# Patient Record
Sex: Female | Born: 1965 | Race: White | Hispanic: No | Marital: Married | Smoking: Never smoker
Health system: Southern US, Community
[De-identification: ages and names within clinical notes are randomized; demographics above are authoritative.]

## PROBLEM LIST (undated history)

## (undated) DIAGNOSIS — F1910 Other psychoactive substance abuse, uncomplicated: Secondary | ICD-10-CM

## (undated) DIAGNOSIS — C801 Malignant (primary) neoplasm, unspecified: Secondary | ICD-10-CM

## (undated) HISTORY — DX: Other psychoactive substance abuse, uncomplicated: F19.10

## (undated) HISTORY — PX: AUGMENTATION MAMMAPLASTY: SUR837

## (undated) HISTORY — DX: Malignant (primary) neoplasm, unspecified: C80.1

---

## 2002-10-14 HISTORY — PX: LAPAROSCOPIC PARTIAL COLECTOMY: SHX5907

## 2004-02-26 LAB — HM COLONOSCOPY: HM Colonoscopy: NORMAL

## 2004-06-24 ENCOUNTER — Ambulatory Visit: Payer: Self-pay | Admitting: Oncology

## 2004-07-14 ENCOUNTER — Ambulatory Visit: Payer: Self-pay | Admitting: Oncology

## 2004-09-03 ENCOUNTER — Ambulatory Visit: Payer: Self-pay | Admitting: Gastroenterology

## 2004-11-05 ENCOUNTER — Ambulatory Visit (HOSPITAL_COMMUNITY): Admission: RE | Admit: 2004-11-05 | Discharge: 2004-11-05 | Payer: Self-pay | Admitting: Oncology

## 2004-11-06 ENCOUNTER — Ambulatory Visit: Payer: Self-pay | Admitting: Oncology

## 2004-11-11 ENCOUNTER — Ambulatory Visit: Payer: Self-pay | Admitting: Oncology

## 2005-04-06 ENCOUNTER — Ambulatory Visit: Payer: Self-pay | Admitting: Oncology

## 2005-04-13 ENCOUNTER — Ambulatory Visit: Payer: Self-pay | Admitting: Oncology

## 2005-09-07 ENCOUNTER — Ambulatory Visit: Payer: Self-pay | Admitting: Oncology

## 2005-09-10 ENCOUNTER — Ambulatory Visit (HOSPITAL_COMMUNITY): Admission: RE | Admit: 2005-09-10 | Discharge: 2005-09-10 | Payer: Self-pay | Admitting: Internal Medicine

## 2005-09-13 ENCOUNTER — Ambulatory Visit: Payer: Self-pay | Admitting: Oncology

## 2006-01-18 ENCOUNTER — Encounter: Admission: RE | Admit: 2006-01-18 | Discharge: 2006-01-18 | Payer: Self-pay | Admitting: Unknown Physician Specialty

## 2006-02-03 ENCOUNTER — Ambulatory Visit: Payer: Self-pay | Admitting: Oncology

## 2006-02-11 ENCOUNTER — Ambulatory Visit: Payer: Self-pay | Admitting: Oncology

## 2006-06-23 ENCOUNTER — Ambulatory Visit: Payer: Self-pay | Admitting: Oncology

## 2006-07-14 ENCOUNTER — Ambulatory Visit: Payer: Self-pay | Admitting: Oncology

## 2006-12-13 ENCOUNTER — Ambulatory Visit: Payer: Self-pay | Admitting: Oncology

## 2007-01-05 ENCOUNTER — Ambulatory Visit: Payer: Self-pay | Admitting: Oncology

## 2007-01-12 ENCOUNTER — Ambulatory Visit: Payer: Self-pay | Admitting: Oncology

## 2007-09-14 ENCOUNTER — Ambulatory Visit: Payer: Self-pay | Admitting: Oncology

## 2007-09-20 ENCOUNTER — Ambulatory Visit (HOSPITAL_COMMUNITY): Admission: RE | Admit: 2007-09-20 | Discharge: 2007-09-20 | Payer: Self-pay | Admitting: Oncology

## 2007-09-21 ENCOUNTER — Ambulatory Visit: Payer: Self-pay | Admitting: Oncology

## 2007-09-29 ENCOUNTER — Ambulatory Visit (HOSPITAL_COMMUNITY): Admission: RE | Admit: 2007-09-29 | Discharge: 2007-09-29 | Payer: Self-pay | Admitting: Gastroenterology

## 2007-10-15 ENCOUNTER — Ambulatory Visit: Payer: Self-pay | Admitting: Oncology

## 2007-11-01 ENCOUNTER — Encounter: Admission: RE | Admit: 2007-11-01 | Discharge: 2007-11-01 | Payer: Self-pay | Admitting: Oncology

## 2009-01-20 ENCOUNTER — Encounter: Admission: RE | Admit: 2009-01-20 | Discharge: 2009-01-20 | Payer: Self-pay | Admitting: Oncology

## 2009-02-11 ENCOUNTER — Ambulatory Visit: Payer: Self-pay | Admitting: Oncology

## 2009-02-13 ENCOUNTER — Ambulatory Visit: Payer: Self-pay | Admitting: Oncology

## 2009-02-17 ENCOUNTER — Ambulatory Visit (HOSPITAL_COMMUNITY): Admission: RE | Admit: 2009-02-17 | Discharge: 2009-02-17 | Payer: Self-pay | Admitting: Oncology

## 2009-03-13 ENCOUNTER — Ambulatory Visit: Payer: Self-pay | Admitting: Oncology

## 2009-07-12 ENCOUNTER — Ambulatory Visit (HOSPITAL_COMMUNITY): Admission: EM | Admit: 2009-07-12 | Discharge: 2009-07-12 | Payer: Self-pay | Admitting: Orthopaedic Surgery

## 2009-09-13 DIAGNOSIS — C801 Malignant (primary) neoplasm, unspecified: Secondary | ICD-10-CM

## 2009-09-13 HISTORY — DX: Malignant (primary) neoplasm, unspecified: C80.1

## 2009-11-07 ENCOUNTER — Encounter: Admission: RE | Admit: 2009-11-07 | Discharge: 2009-11-07 | Payer: Self-pay | Admitting: Occupational Medicine

## 2010-02-11 ENCOUNTER — Ambulatory Visit: Payer: Self-pay | Admitting: Oncology

## 2010-02-25 ENCOUNTER — Encounter: Admission: RE | Admit: 2010-02-25 | Discharge: 2010-02-25 | Payer: Self-pay | Admitting: Oncology

## 2010-03-05 ENCOUNTER — Encounter: Admission: RE | Admit: 2010-03-05 | Discharge: 2010-03-05 | Payer: Self-pay | Admitting: Oncology

## 2010-03-12 ENCOUNTER — Ambulatory Visit: Payer: Self-pay | Admitting: Oncology

## 2010-03-12 ENCOUNTER — Ambulatory Visit: Payer: Self-pay | Admitting: Obstetrics & Gynecology

## 2010-03-13 ENCOUNTER — Ambulatory Visit: Payer: Self-pay | Admitting: Oncology

## 2010-07-21 ENCOUNTER — Emergency Department (HOSPITAL_COMMUNITY): Admission: EM | Admit: 2010-07-21 | Discharge: 2010-07-21 | Payer: Self-pay | Admitting: Family Medicine

## 2010-09-24 ENCOUNTER — Ambulatory Visit (HOSPITAL_COMMUNITY)
Admission: RE | Admit: 2010-09-24 | Discharge: 2010-09-24 | Payer: Self-pay | Source: Home / Self Care | Attending: Gastroenterology | Admitting: Gastroenterology

## 2010-10-04 ENCOUNTER — Encounter: Payer: Self-pay | Admitting: Oncology

## 2010-12-17 LAB — POCT I-STAT 4, (NA,K, GLUC, HGB,HCT)
Hemoglobin: 13.6 g/dL (ref 12.0–15.0)
Potassium: 5.8 mEq/L — ABNORMAL HIGH (ref 3.5–5.1)
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
## 2011-01-26 NOTE — Op Note (Signed)
NAME:  Carla King, Carla King                ACCOUNT NO.:  [id number]   MEDICAL RECORD NO.:  [id number]          PATIENT TYPE:  AMB   LOCATION:  [facility]O                         FACILITY:  [facility]   PHYSICIAN:  Petra Kuba, M.D.    DATE OF BIRTH:  1966-01-04   DATE OF PROCEDURE:  DATE OF DISCHARGE:                               OPERATIVE REPORT   PROCEDURE:  Colonoscopy.   INDICATIONS:  History of colon cancer, due for colonic screening.   Consent was signed after risks, benefits, and options were thoroughly  discussed in the office.   MEDICATIONS USED:  Fentanyl 125, Versed 15, Phenergan 25.   PROCEDURE:  Rectal inspection is pertinent for external hemorrhoids,  small.  The general examination was negative.  Video pediatric  colonoscope was inserted and easily advanced around the colon to the  anastomosis, which was identified by the small bowel and to colon side.  No abnormalities were seen on insertion.  The scope was inserted a short  ways into the terminal ileum which was normal.  Photo documentation was  obtained.  The scope was slowly withdrawn.  Prep was adequate.  There  was some liquid stool that required washing and suction on slow withdraw  back to the rectum.  No polyps, tumors, masses, or diverticula were  seen.  Once back in the rectum, anal rectal __________ on retroflexion  confirmed some small hemorrhoids.  The scope was straightened and re-  advanced a short ways up the left side of the colon.  Air was suctioned  and scope removed.  The patient tolerated the procedure well.  There was  no obvious immediate complication.   ENDOSCOPIC DIAGNOSES:  1. Internal and external small hemorrhoids.  2. Otherwise within normal limits to the anastomosis in the right      colon and terminal ileum.   PLAN:  I will see her back p.r.n.  Return care to her oncology team and  I will recheck colon screening in three years.           ______________________________  Petra Kuba,  M.D.     MEM/MEDQ  D:  09/29/2007  T:  09/29/2007  Job:  [id number]   cc:   Johney Maine

## 2012-01-17 ENCOUNTER — Telehealth: Payer: Self-pay | Admitting: Internal Medicine

## 2012-01-17 DIAGNOSIS — Z1239 Encounter for other screening for malignant neoplasm of breast: Secondary | ICD-10-CM

## 2012-01-17 NOTE — Telephone Encounter (Signed)
Patient wanting a mammogram done before her physical on June 7,2013 because she has had colon cancer.

## 2012-01-17 NOTE — Telephone Encounter (Signed)
she has not been seen in this office yet.  Does she want to have it done at her usual localse (in [place]) or in [place]?

## 2012-01-18 NOTE — Telephone Encounter (Signed)
order in [place] 

## 2012-01-18 NOTE — Telephone Encounter (Signed)
Patient has gone to [place] in the past

## 2012-02-18 ENCOUNTER — Ambulatory Visit (INDEPENDENT_AMBULATORY_CARE_PROVIDER_SITE_OTHER): Payer: BC Managed Care – PPO | Admitting: Internal Medicine

## 2012-02-18 ENCOUNTER — Other Ambulatory Visit (HOSPITAL_COMMUNITY)
Admission: RE | Admit: 2012-02-18 | Discharge: 2012-02-18 | Disposition: A | Discharge status: Home / Self Care | Payer: BC Managed Care – PPO | Source: Ambulatory Visit | Attending: Internal Medicine | Admitting: Internal Medicine

## 2012-02-18 ENCOUNTER — Encounter: Payer: Self-pay | Admitting: Internal Medicine

## 2012-02-18 VITALS — BP 94/62 | HR 73 | Temp 98.2°F | Resp 14 | Ht 64.5 in | Wt 129.0 lb

## 2012-02-18 DIAGNOSIS — F419 Anxiety disorder, unspecified: Secondary | ICD-10-CM | POA: Insufficient documentation

## 2012-02-18 DIAGNOSIS — R5383 Other fatigue: Secondary | ICD-10-CM

## 2012-02-18 DIAGNOSIS — Z01419 Encounter for gynecological examination (general) (routine) without abnormal findings: Secondary | ICD-10-CM | POA: Insufficient documentation

## 2012-02-18 DIAGNOSIS — F418 Other specified anxiety disorders: Secondary | ICD-10-CM

## 2012-02-18 DIAGNOSIS — F411 Generalized anxiety disorder: Secondary | ICD-10-CM

## 2012-02-18 DIAGNOSIS — Z0000 Encounter for general adult medical examination without abnormal findings: Secondary | ICD-10-CM

## 2012-02-18 DIAGNOSIS — F341 Dysthymic disorder: Secondary | ICD-10-CM

## 2012-02-18 DIAGNOSIS — Z124 Encounter for screening for malignant neoplasm of cervix: Secondary | ICD-10-CM

## 2012-02-18 DIAGNOSIS — Z1239 Encounter for other screening for malignant neoplasm of breast: Secondary | ICD-10-CM

## 2012-02-18 DIAGNOSIS — R5381 Other malaise: Secondary | ICD-10-CM

## 2012-02-18 DIAGNOSIS — F5105 Insomnia due to other mental disorder: Secondary | ICD-10-CM

## 2012-02-18 DIAGNOSIS — Z13220 Encounter for screening for lipoid disorders: Secondary | ICD-10-CM

## 2012-02-18 DIAGNOSIS — Z85038 Personal history of other malignant neoplasm of large intestine: Secondary | ICD-10-CM

## 2012-02-18 DIAGNOSIS — F489 Nonpsychotic mental disorder, unspecified: Secondary | ICD-10-CM

## 2012-02-18 LAB — CBC WITH DIFFERENTIAL/PLATELET
Basophils Absolute: 0 10*3/uL (ref 0.0–0.1)
Basophils Relative: 1 % (ref 0–1)
Eosinophils Absolute: 0 10*3/uL (ref 0.0–0.7)
MCH: 30.7 pg (ref 26.0–34.0)
MCHC: 33.2 g/dL (ref 30.0–36.0)
Neutrophils Relative %: 65 % (ref 43–77)
Platelets: 257 10*3/uL (ref 150–400)
RDW: 12.8 % (ref 11.5–15.5)

## 2012-02-18 LAB — CEA: CEA: 0.8 ng/mL (ref 0.0–5.0)

## 2012-02-18 LAB — TSH: TSH: 0.341 u[IU]/mL — ABNORMAL LOW (ref 0.350–4.500)

## 2012-02-18 MED ORDER — ALPRAZOLAM 0.5 MG PO TBDP: 0.5000 mg | ORAL_TABLET | Freq: Every day | ORAL | Status: AC | PRN

## 2012-02-18 MED ORDER — ZOLPIDEM TARTRATE 10 MG PO TABS: 10.0000 mg | ORAL_TABLET | Freq: Every evening | ORAL | Status: DC | PRN

## 2012-02-18 NOTE — Progress Notes (Signed)
Patient ID: Carla King, female   DOB: 1966-07-19, 46 y.o.   MRN: [id number] Annual exam. . Last seen one year ago,  [name] through divorce.,.  Insomnia, anxiety, wt loss,  Depression.  Not drinking.   Subjective:     Carla King is a 46 y.o. female here for a routine exam.  Current complaints: anxiety, insomnia  Personal health questionnaire reviewed: yes.   Gynecologic History Patient's last menstrual period was 02/07/2012. Contraception: none Last Pap: 2011. Results were: normal Last mammogram: 2012. Results were: normal  Obstetric History OB History    Grav Para Term Preterm Abortions TAB SAB Ect Mult Living                   The following portions of the patient's history were reviewed and updated as appropriate: allergies, current medications, past family history, past medical history, past social history, past surgical history and problem list.  Review of Systems Pertinent items are noted in HPI.    Objective:    BP 94/62  Pulse 73  Temp(Src) 98.2 F (36.8 C) (Oral)  Resp 14  Ht 5' 4.5" (1.638 m)  Wt 129 lb (58.514 kg)  BMI 21.80 kg/m2  SpO2 97%  LMP 02/07/2012  General Appearance:    Alert, cooperative, no distress, appears stated age  Head:    Normocephalic, without obvious abnormality, atraumatic  Eyes:    PERRL, conjunctiva/corneas clear, EOM's intact, fundi    benign, both eyes  Ears:    Normal TM's and external ear canals, both ears  Nose:   Nares normal, septum midline, mucosa normal, no drainage    or sinus tenderness  Throat:   Lips, mucosa, and tongue normal; teeth and gums normal  Neck:   Supple, symmetrical, trachea midline, no adenopathy;    thyroid:  no enlargement/tenderness/nodules; no carotid   bruit or JVD  Back:     Symmetric, no curvature, ROM normal, no CVA tenderness  Lungs:     Clear to auscultation bilaterally, respirations unlabored  Chest Wall:    No tenderness or deformity   Heart:    Regular rate and rhythm, S1 and S2 normal, no  murmur, rub   or gallop  Breast Exam:    No tenderness, masses, or nipple abnormality  Abdomen:     Soft, non-tender, bowel sounds active all four quadrants,    no masses, no organomegaly  Genitalia:    Normal female without lesion, discharge or tenderness  Rectal:    Normal tone, normal prostate, no masses or tenderness;   guaiac negative stool  Extremities:   Extremities normal, atraumatic, no cyanosis or edema  Pulses:   2+ and symmetric all extremities  Skin:   Skin color, texture, turgor normal, no rashes or lesions  Lymph nodes:   Cervical, supraclavicular, and axillary nodes normal  Neurologic:   CNII-XII intact, normal strength, sensation and reflexes    throughout      Assessment:    Healthy female exam.    Plan:    Education reviewed: depression evaluation. Mammogram ordered.    Routine general medical examination at a health care facility PAP and pelvic were done breast exam done.  She has breast implants; mammogram ordered.   Anxiety associated with depression Trial of alprazolam  Low dose for management of evening anxiety.  Symptoms are circumstance driven (divorce) and therefore 30 days of medication discussed,  If nore is needed she will return to discuss starting SSRI therapy  Updated Medication List Outpatient Encounter Prescriptions as of 02/18/2012  Medication Sig Dispense Refill  . ALPRAZolam (NIRAVAM) 0.5 MG dissolvable tablet Take 1 tablet (0.5 mg total) by mouth daily as needed for anxiety.  30 tablet  0  . zolpidem (AMBIEN) 10 MG tablet Take 1 tablet (10 mg total) by mouth at bedtime as needed for sleep.  30 tablet  1

## 2012-02-18 NOTE — Patient Instructions (Signed)
Sign up for my chart Get your mammogram   return one month

## 2012-02-19 LAB — COMPLETE METABOLIC PANEL WITH GFR
Albumin: 4.6 g/dL (ref 3.5–5.2)
BUN: 8 mg/dL (ref 6–23)
CO2: 22 mEq/L (ref 19–32)
GFR, Est African American: >89 mL/min
GFR, Est Non African American: >89 mL/min
Glucose, Bld: 88 mg/dL (ref 70–99)
Potassium: 4 mEq/L (ref 3.5–5.3)
Sodium: 139 mEq/L (ref 135–145)
Total Protein: 7 g/dL (ref 6.0–8.3)

## 2012-02-19 LAB — LIPID PANEL
Cholesterol: 180 mg/dL (ref 0–200)
HDL: 68 mg/dL (ref >39)

## 2012-02-20 NOTE — Assessment & Plan Note (Signed)
Trial of alprazolam  Low dose for management of evening anxiety.  Symptoms are circumstance driven (divorce) and therefore 30 days of medication discussed,  If nore is needed she will return to discuss starting SSRI therapy

## 2012-02-20 NOTE — Assessment & Plan Note (Signed)
PAP and pelvic were done breast exam done.  She has breast implants; mammogram ordered.

## 2012-02-24 ENCOUNTER — Ambulatory Visit: Payer: Self-pay | Admitting: Internal Medicine

## 2012-03-07 ENCOUNTER — Telehealth: Payer: Self-pay | Admitting: Internal Medicine

## 2012-03-07 NOTE — Telephone Encounter (Signed)
Pt called checking on therapist appointment at [facility]

## 2012-03-10 ENCOUNTER — Encounter: Payer: Self-pay | Admitting: Internal Medicine

## 2012-03-14 NOTE — Telephone Encounter (Signed)
I tried calling them on Friday however I didn't leave a message.  However I have left message today for them to return my call.

## 2012-03-15 NOTE — Telephone Encounter (Signed)
Darl Pikes returned my call and she states that she didn't see a referral she asked that the patient call and make her own appointment. I called patient and gave her their number which is [phone].

## 2012-03-15 NOTE — Telephone Encounter (Signed)
Carla King from [facility] called back and left me a VM asking me to return her call.  I have left another message for her to return my call.

## 2012-05-22 ENCOUNTER — Encounter: Payer: Self-pay | Admitting: Internal Medicine

## 2012-12-22 ENCOUNTER — Ambulatory Visit: Payer: BC Managed Care – PPO | Admitting: Internal Medicine

## 2013-02-21 ENCOUNTER — Telehealth: Payer: Self-pay | Admitting: *Deleted

## 2013-02-21 DIAGNOSIS — R5383 Other fatigue: Secondary | ICD-10-CM

## 2013-02-21 DIAGNOSIS — R7989 Other specified abnormal findings of blood chemistry: Secondary | ICD-10-CM

## 2013-02-21 DIAGNOSIS — E785 Hyperlipidemia, unspecified: Secondary | ICD-10-CM

## 2013-02-21 NOTE — Telephone Encounter (Signed)
Pt is coming in for labs Friday 06.13.2014 what labs and dx?

## 2013-02-23 ENCOUNTER — Encounter: Payer: Self-pay | Admitting: Internal Medicine

## 2013-02-23 ENCOUNTER — Encounter (INDEPENDENT_AMBULATORY_CARE_PROVIDER_SITE_OTHER): Payer: BC Managed Care – PPO | Admitting: Internal Medicine

## 2013-02-23 ENCOUNTER — Ambulatory Visit (INDEPENDENT_AMBULATORY_CARE_PROVIDER_SITE_OTHER): Payer: BC Managed Care – PPO | Admitting: Internal Medicine

## 2013-02-23 DIAGNOSIS — R7989 Other specified abnormal findings of blood chemistry: Secondary | ICD-10-CM

## 2013-02-23 DIAGNOSIS — Z85038 Personal history of other malignant neoplasm of large intestine: Secondary | ICD-10-CM

## 2013-02-23 DIAGNOSIS — R5383 Other fatigue: Secondary | ICD-10-CM

## 2013-02-23 DIAGNOSIS — F419 Anxiety disorder, unspecified: Secondary | ICD-10-CM

## 2013-02-23 DIAGNOSIS — F411 Generalized anxiety disorder: Secondary | ICD-10-CM

## 2013-02-23 DIAGNOSIS — F489 Nonpsychotic mental disorder, unspecified: Secondary | ICD-10-CM

## 2013-02-23 DIAGNOSIS — R5381 Other malaise: Secondary | ICD-10-CM

## 2013-02-23 DIAGNOSIS — E785 Hyperlipidemia, unspecified: Secondary | ICD-10-CM

## 2013-02-23 DIAGNOSIS — R6889 Other general symptoms and signs: Secondary | ICD-10-CM

## 2013-02-23 DIAGNOSIS — F341 Dysthymic disorder: Secondary | ICD-10-CM

## 2013-02-23 DIAGNOSIS — F418 Other specified anxiety disorders: Secondary | ICD-10-CM

## 2013-02-23 LAB — COMPREHENSIVE METABOLIC PANEL
Albumin: 4.2 g/dL (ref 3.5–5.2)
BUN: 15 mg/dL (ref 6–23)
CO2: 23 mEq/L (ref 19–32)
GFR: 131.05 mL/min (ref >60.00)
Glucose, Bld: 86 mg/dL (ref 70–99)
Potassium: 4.2 mEq/L (ref 3.5–5.1)
Sodium: 141 mEq/L (ref 135–145)
Total Protein: 6.4 g/dL (ref 6.0–8.3)

## 2013-02-23 LAB — CBC WITH DIFFERENTIAL/PLATELET
Eosinophils Relative: 2.6 % (ref 0.0–5.0)
Lymphocytes Relative: 35.7 % (ref 12.0–46.0)
Monocytes Relative: 9.6 % (ref 3.0–12.0)
Neutrophils Relative %: 51.3 % (ref 43.0–77.0)
Platelets: 226 10*3/uL (ref 150.0–400.0)
RBC: 4.1 Mil/uL (ref 3.87–5.11)
WBC: 4.2 10*3/uL — ABNORMAL LOW (ref 4.5–10.5)

## 2013-02-23 LAB — T4, FREE: Free T4: 1.05 ng/dL (ref 0.60–1.60)

## 2013-02-23 LAB — TSH: TSH: 1.79 u[IU]/mL (ref 0.35–5.50)

## 2013-02-23 LAB — LIPID PANEL
Cholesterol: 169 mg/dL (ref 0–200)
HDL: 68.3 mg/dL (ref >39.00)

## 2013-02-23 MED ORDER — CLONAZEPAM 0.5 MG PO TABS: 0.5000 mg | ORAL_TABLET | Freq: Two times a day (BID) | ORAL | Status: DC | PRN

## 2013-02-23 MED ORDER — TRAZODONE HCL 50 MG PO TABS: 25.0000 mg | ORAL_TABLET | Freq: Every evening | ORAL | Status: DC | PRN

## 2013-02-23 MED ORDER — CLONAZEPAM 0.5 MG PO TABS: 0.5000 mg | ORAL_TABLET | Freq: Every day | ORAL | Status: DC | PRN

## 2013-02-23 NOTE — Patient Instructions (Addendum)
I am starting you on trazodone at bedtime for your insomnia,  Take it 1 hour before lights out. We can increase this weekly by 25 mg until you reach 100 mg   I will refill you klonopin on a monthly basis pending the opinion of Dr . Maryruth Bun  Let me know how yo are doing in 1-2 weeks,  And we will discuss starting buspar

## 2013-02-23 NOTE — Progress Notes (Signed)
Patient ID: Carla King, female   DOB: 27-Mar-1966, 47 y.o.   MRN: [id number]   Patient Active Problem List   Diagnosis Date Noted  . History of colon cancer 02/23/2013  . Insomnia secondary to anxiety 02/18/2012  . Anxiety associated with depression 02/18/2012  . Routine general medical examination at a health care facility 02/18/2012    Subjective:  CC:   No chief complaint on file.   HPI:   Carla Laseter Hardenis a 47 y.o. female who presents for her annual GYN exam but she arrived (arrived on time but was was checked in 10 minutes late)  for her annual physical exam and is currently menstruating, so PE was deferred and evaluation of worsening anxiety was done .  She cites multiple  life stressors.   Her mammogram is scheduled..  Since her last visit her divorce has finalized ,  Her youngest  son just graduated from high school is scheduled to matriculate at [employer],  Her boyfriend of 1.5 yrs ans moved in with her and she has celebrated 3 years of sobriety in March.  She continues to attend AA meetings 3 days per week. Her boyfriend is also a recovering alcoholic and has been sober x 6 years. She also recently received  joint custody of his 39 yr old son  Who spends every other weekend with them.  Her oldest son was recently arrested for felony possession of marijuana which has has been using for management of anxiety complicated by recurrent morning episodes of emesis.  He is a Archivist.  She has been having persistent insomnia,  Not responding to melatonin and benadryl.  She was previously offered [place] but has avoided it due to prior adverse events which including sleepwalking.  She has no prior trial of trazadone.   She has persistnet anxiety during the day and admits to trying her boyfriend's prescribed clonazepam at 0.5 mg and notes that her anxiety is controlled with once daily use of it.  She did not like the way alprazolam made her feel on prior trials.  She has had prior  trials of zoloft and remeron years ago with weight  gain and no appreciable improvement  on anxiety .    No past medical history on file.  No past surgical history on file.     The following portions of the patient's history were reviewed and updated as appropriate: Allergies, current medications, and problem list.    Review of Systems:   12 Pt  review of systems was negative except those addressed in the HPI,     History   Social History  . Marital Status: Married    Spouse Name: N/A    Number of Children: N/A  . Years of Education: N/A   Occupational History  . Not on file.   Social History Main Topics  . Smoking status: Never Smoker   . Smokeless tobacco: Never Used  . Alcohol Use: No  . Drug Use: No  . Sexually Active: Not on file   Other Topics Concern  . Not on file   Social History Narrative  . No narrative on file    Objective:  There were no vitals taken for this visit.  General appearance: alert, cooperative and appears stated age Neck: no adenopathy, no carotid bruit, supple, symmetrical, trachea midline and thyroid not enlarged, symmetric, no tenderness/mass/nodules Back: symmetric, no curvature. ROM normal. No CVA tenderness. Lungs: clear to auscultation bilaterally Heart: regular rate and rhythm, S1,  S2 normal, no murmur, click, rub or gallop Abdomen: soft, non-tender; bowel sounds normal; no masses,  no organomegaly Pulses: 2+ and symmetric Skin: Skin color, texture, turgor normal. No rashes or lesions Lymph nodes: Cervical, supraclavicular, and axillary nodes normal. Psych: affect normal, makes good eye contact Neuro: grossly nonfocal.   Assessment and Plan:  History of colon cancer She is overdue for follow up colonoscopy but has been delaying due to cost.  FOBT offered but she has decided to call [facility] to set up  Follow up colonosopy.  CEA ordered.   Insomnia secondary to anxiety Trial of trazodone with dose titration planned  every 2 weeks to maximum of 100 mg   Anxiety associated with depression Trial of once daily clonazepam with plans to initiate trial of buspar pending evaluation by Dr. 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    A total of 40 minutes was spent with patient more than half of which was spent in counseling, reviewing records from other providers and coordination of care.  Updated Medication List Outpatient Encounter Prescriptions as of 02/23/2013  Medication Sig Dispense Refill  . clonazePAM (KLONOPIN) 0.5 MG tablet Take 1 tablet (0.5 mg total) by mouth daily as needed for anxiety.  30 tablet  0  . traZODone (DESYREL) 50 MG tablet Take 0.5-1 tablets (25-50 mg total) by mouth at bedtime as needed for sleep.  30 tablet  3  . zolpidem (AMBIEN) 10 MG tablet Take 1 tablet (10 mg total) by mouth at bedtime as needed for sleep.  30 tablet  1  . [DISCONTINUED] clonazePAM (KLONOPIN) 0.5 MG tablet Take 1 tablet (0.5 mg total) by mouth 2 (two) times daily as needed for anxiety.  30 tablet  0   No facility-administered encounter medications on file as of 02/23/2013.

## 2013-02-23 NOTE — Assessment & Plan Note (Addendum)
She is overdue for follow up colonoscopy but has been delaying due to cost.  FOBT offered but she has decided to call [facility] to set up  Follow up colonosopy.  CEA ordered.

## 2013-02-25 ENCOUNTER — Encounter: Payer: Self-pay | Admitting: Internal Medicine

## 2013-02-25 DIAGNOSIS — C801 Malignant (primary) neoplasm, unspecified: Secondary | ICD-10-CM | POA: Insufficient documentation

## 2013-02-25 NOTE — Assessment & Plan Note (Addendum)
Trial of once daily clonazepam with plans to initiate trial of buspar pending evaluation by Dr. 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

## 2013-02-25 NOTE — Assessment & Plan Note (Signed)
Trial of trazodone with dose titration planned every 2 weeks to maximum of 100 mg

## 2013-03-02 ENCOUNTER — Encounter: Payer: Self-pay | Admitting: Emergency Medicine

## 2013-03-09 ENCOUNTER — Telehealth: Payer: Self-pay | Admitting: Internal Medicine

## 2013-03-09 MED ORDER — TRAZODONE HCL 100 MG PO TABS: 100.0000 mg | ORAL_TABLET | Freq: Every day | ORAL | Status: DC

## 2013-03-09 NOTE — Telephone Encounter (Signed)
Pt called need refill on  trazadone pt stated she is taking 100mg  at night  Pt only has 3 of the 79m [facility] [place] Please advise pt when this is called.  Pt stated she has left 3 message on triage line with no returned calls

## 2013-03-09 NOTE — Telephone Encounter (Signed)
PT came by office, requesting Trazodone 100 mg be sent to pharmacy, so she wouldn't have to take 2 50mg  tablets nightly. Rx sent to pharmacy by escript

## 2013-03-09 NOTE — Telephone Encounter (Signed)
Left message for pt to return my call. Advised that pt had refills available at [facility].

## 2013-03-11 ENCOUNTER — Encounter: Payer: Self-pay | Admitting: Internal Medicine

## 2013-03-30 ENCOUNTER — Other Ambulatory Visit: Payer: Self-pay | Admitting: Internal Medicine

## 2013-04-02 ENCOUNTER — Encounter: Payer: Self-pay | Admitting: Internal Medicine

## 2013-04-02 MED ORDER — CLONAZEPAM 0.5 MG PO TABS: 0.5000 mg | ORAL_TABLET | Freq: Every day | ORAL | Status: DC | PRN

## 2013-04-03 NOTE — Telephone Encounter (Signed)
Called in.

## 2013-06-26 NOTE — Progress Notes (Signed)
This encounter was created in error - please disregard.

## 2013-07-19 ENCOUNTER — Other Ambulatory Visit: Payer: Self-pay

## 2014-12-20 ENCOUNTER — Encounter: Payer: Self-pay | Admitting: Internal Medicine

## 2015-02-11 ENCOUNTER — Other Ambulatory Visit: Payer: Self-pay | Admitting: Internal Medicine

## 2015-02-11 ENCOUNTER — Ambulatory Visit
Admission: RE | Admit: 2015-02-11 | Discharge: 2015-02-11 | Disposition: A | Discharge status: Home / Self Care | Payer: BLUE CROSS/BLUE SHIELD | Source: Ambulatory Visit | Attending: Internal Medicine | Admitting: Internal Medicine

## 2015-02-11 DIAGNOSIS — Z1231 Encounter for screening mammogram for malignant neoplasm of breast: Secondary | ICD-10-CM | POA: Insufficient documentation

## 2015-02-11 DIAGNOSIS — R922 Inconclusive mammogram: Secondary | ICD-10-CM | POA: Diagnosis not present

## 2016-04-02 ENCOUNTER — Other Ambulatory Visit: Payer: Self-pay | Admitting: Internal Medicine

## 2016-04-02 DIAGNOSIS — R1032 Left lower quadrant pain: Principal | ICD-10-CM

## 2016-04-02 DIAGNOSIS — R1031 Right lower quadrant pain: Secondary | ICD-10-CM

## 2016-05-10 ENCOUNTER — Other Ambulatory Visit: Payer: Self-pay | Admitting: Internal Medicine

## 2016-05-10 DIAGNOSIS — Z1231 Encounter for screening mammogram for malignant neoplasm of breast: Secondary | ICD-10-CM

## 2016-05-11 ENCOUNTER — Ambulatory Visit
Admission: RE | Admit: 2016-05-11 | Discharge: 2016-05-11 | Disposition: A | Discharge status: Home / Self Care | Payer: BLUE CROSS/BLUE SHIELD | Source: Ambulatory Visit | Attending: Internal Medicine | Admitting: Internal Medicine

## 2016-05-11 ENCOUNTER — Other Ambulatory Visit: Payer: Self-pay | Admitting: Internal Medicine

## 2016-05-11 DIAGNOSIS — Z1231 Encounter for screening mammogram for malignant neoplasm of breast: Secondary | ICD-10-CM

## 2017-08-25 ENCOUNTER — Telehealth: Payer: Self-pay | Admitting: Gastroenterology

## 2017-08-25 NOTE — Telephone Encounter (Signed)
1. Patient would like Dr. Allen Norris to call her to discuss colonoscopy. She knows him from the hospital 2. She needs to schedule colonoscopy

## 2017-08-26 NOTE — Telephone Encounter (Signed)
Spoke with pt and she really needs to speak with you prior to scheduling her colonoscopy.  Her number is [phone].

## 2017-09-23 ENCOUNTER — Encounter: Payer: Self-pay | Admitting: *Deleted

## 2018-04-04 ENCOUNTER — Other Ambulatory Visit: Payer: Self-pay | Admitting: Internal Medicine

## 2018-05-24 ENCOUNTER — Ambulatory Visit: Payer: Self-pay | Attending: Oncology | Admitting: *Deleted

## 2018-05-24 ENCOUNTER — Encounter: Payer: Self-pay | Admitting: *Deleted

## 2018-05-24 ENCOUNTER — Other Ambulatory Visit: Payer: Self-pay

## 2018-05-24 ENCOUNTER — Ambulatory Visit
Admission: RE | Admit: 2018-05-24 | Discharge: 2018-05-24 | Disposition: A | Discharge status: Home / Self Care | Payer: Self-pay | Source: Ambulatory Visit | Attending: Oncology | Admitting: Oncology

## 2018-05-24 ENCOUNTER — Other Ambulatory Visit: Payer: Self-pay | Admitting: Oncology

## 2018-05-24 DIAGNOSIS — Z0000 Encounter for general adult medical examination without abnormal findings: Secondary | ICD-10-CM

## 2018-05-24 NOTE — Patient Instructions (Signed)
HPV Test The human papillomavirus (HPV) test is used to look for high-risk types of HPV infection. HPV is a group of about 100 viruses. Many of these viruses cause growths on, in, or around the genitals. Most HPV viruses cause infections that usually go away without treatment. However, HPV types 6, 11, 16, and 18 are considered high-risk types of HPV that can increase your risk of cancer of the cervix or anus if the infection is left untreated. An HPV test identifies the DNA (genetic) strands of the HPV infection, so it is also referred to as the HPV DNA test. Although HPV is found in both males and females, the HPV test is only used to screen for increased cancer risk in females:  With an abnormal Pap test.  After treatment of an abnormal Pap test.  Between the ages of 30 and 65.  After treatment of a high-risk HPV infection.  The HPV test may be done at the same time as a pelvic exam and Pap test in females over the age of 30. Both the HPV test and Pap test require a sample of cells from the cervix. How do I prepare for this test?  Do not douche or take a bath for 24-48 hours before the test or as directed by your health care provider.  Do not have sex for 24-48 hours before the test or as directed by your health care provider.  You may be asked to reschedule the test if you are menstruating.  You will be asked to urinate before the test. What do the results mean? It is your responsibility to obtain your test results. Ask the lab or department performing the test when and how you will get your results. Talk with your health care provider if you have any questions about your results. Your result will be negative or positive. Meaning of Negative Test Results A negative HPV test result means that no HPV was found, and it is very likely that you do not have HPV. Meaning of Positive Test Results A positive HPV test result indicates that you have HPV.  If your test result shows the presence  of any high-risk HPV strains, you may have an increased risk of developing cancer of the cervix or anus if the infection is left untreated.  If any low-risk HPV strains are found, you are not likely to have an increased risk of cancer.  Discuss your test results with your health care provider. He or she will use the results to make a diagnosis and determine a treatment plan that is right for you. Talk with your health care provider to discuss your results, treatment options, and if necessary, the need for more tests. Talk with your health care provider if you have any questions about your results. This information is not intended to replace advice given to you by your health care provider. Make sure you discuss any questions you have with your health care provider. Document Released: 09/24/2004 Document Revised: 05/05/2016 Document Reviewed: 01/15/2014 Elsevier Interactive Patient Education  2018 Elsevier Inc.  

## 2018-05-24 NOTE — Progress Notes (Signed)
  Subjective:     Patient ID: Carla King, female   DOB: 25-Jan-1966, 52 y.o.   MRN: [id number]  HPI   Review of Systems     Objective:   Physical Exam  Pulmonary/Chest: Right breast exhibits no inverted nipple, no mass, no nipple discharge, no skin change and no tenderness. Left breast exhibits no inverted nipple, no mass, no nipple discharge, no skin change and no tenderness.    Abdominal: There is no splenomegaly or hepatomegaly.    Genitourinary: No labial fusion. There is no rash, tenderness, lesion or injury on the right labia. There is no rash, tenderness, lesion or injury on the left labia. Cervix exhibits no motion tenderness, no discharge and no friability. Right adnexum displays no mass, no tenderness and no fullness. Left adnexum displays no mass, no tenderness and no fullness. No erythema, tenderness or bleeding in the vagina. No foreign body in the vagina. No signs of injury around the vagina. No vaginal discharge found.         Assessment:     52 year old White female presents to [facility] for clinical breast exam, pap smear and mammogram.  Patient has a history of colon cancer diagnosed in 2005.  She did undergo surgery and chemotherapy at that time.  On clinical breast exam bilateral breast have implants.  There is no dominant mass, skin changes, nipple discharge or lymphadenopathy.  Taught self breast awareness. Specimen collected for pap smear without difficulty.  Patient has been screened for eligibility.  She does not have any insurance, Medicare or Medicaid.  She also meets financial eligibility.  Hand-out given on the Affordable Care Act. Risk Assessment    Risk Scores      05/24/2018   Last edited by: Theodore Demark, RN   5-year risk: 1.2 %   Lifetime risk: 9.6 %            Plan:     Screening mammogram ordered.  Specimen for pap smear sent to the lab.  Will follow-up per [facility] protocol.

## 2018-05-30 LAB — PAP LB AND HPV HIGH-RISK
HPV, high-risk: NEGATIVE
PAP Smear Comment: 0

## 2018-06-02 ENCOUNTER — Encounter: Payer: Self-pay | Admitting: *Deleted

## 2018-06-02 NOTE — Progress Notes (Signed)
Letter mailed to inform patient of her normal mammogram and pap smear.  Next mammo due in 1 year and pap smear in 5 years.  [facility] to [place].

## 2019-10-04 ENCOUNTER — Other Ambulatory Visit: Payer: Self-pay | Admitting: Internal Medicine

## 2019-10-04 DIAGNOSIS — Z1231 Encounter for screening mammogram for malignant neoplasm of breast: Secondary | ICD-10-CM

## 2019-10-16 ENCOUNTER — Encounter: Payer: Self-pay | Admitting: *Deleted

## 2020-04-17 ENCOUNTER — Inpatient Hospital Stay: Admission: RE | Admit: 2020-04-17 | Payer: BC Managed Care – PPO | Source: Ambulatory Visit

## 2020-05-13 ENCOUNTER — Other Ambulatory Visit: Payer: Self-pay

## 2020-05-13 ENCOUNTER — Ambulatory Visit
Admission: RE | Admit: 2020-05-13 | Discharge: 2020-05-13 | Disposition: A | Discharge status: Home / Self Care | Payer: BC Managed Care – PPO | Source: Ambulatory Visit | Attending: Internal Medicine | Admitting: Internal Medicine

## 2020-05-13 DIAGNOSIS — Z1231 Encounter for screening mammogram for malignant neoplasm of breast: Secondary | ICD-10-CM | POA: Diagnosis not present

## 2020-06-24 ENCOUNTER — Ambulatory Visit (LOCAL_COMMUNITY_HEALTH_CENTER): Payer: Self-pay

## 2020-06-24 ENCOUNTER — Other Ambulatory Visit: Payer: Self-pay

## 2020-06-24 DIAGNOSIS — Z23 Encounter for immunization: Secondary | ICD-10-CM

## 2020-11-26 ENCOUNTER — Other Ambulatory Visit: Payer: Self-pay | Admitting: Internal Medicine

## 2020-11-26 DIAGNOSIS — R55 Syncope and collapse: Secondary | ICD-10-CM

## 2020-12-15 ENCOUNTER — Other Ambulatory Visit: Payer: Self-pay

## 2020-12-19 ENCOUNTER — Other Ambulatory Visit: Payer: Self-pay

## 2022-06-10 IMAGING — MG DIGITAL SCREENING BREAST BILAT IMPLANT W/ TOMO W/ CAD
8 of 14 series · 8 of 34 positions shown · non-contrast
Comparison: Previous exam(s).

CLINICAL DATA: Screening.

EXAM:
DIGITAL SCREENING BILATERAL MAMMOGRAM WITH IMPLANTS, CAD AND TOMO

[L MLO]
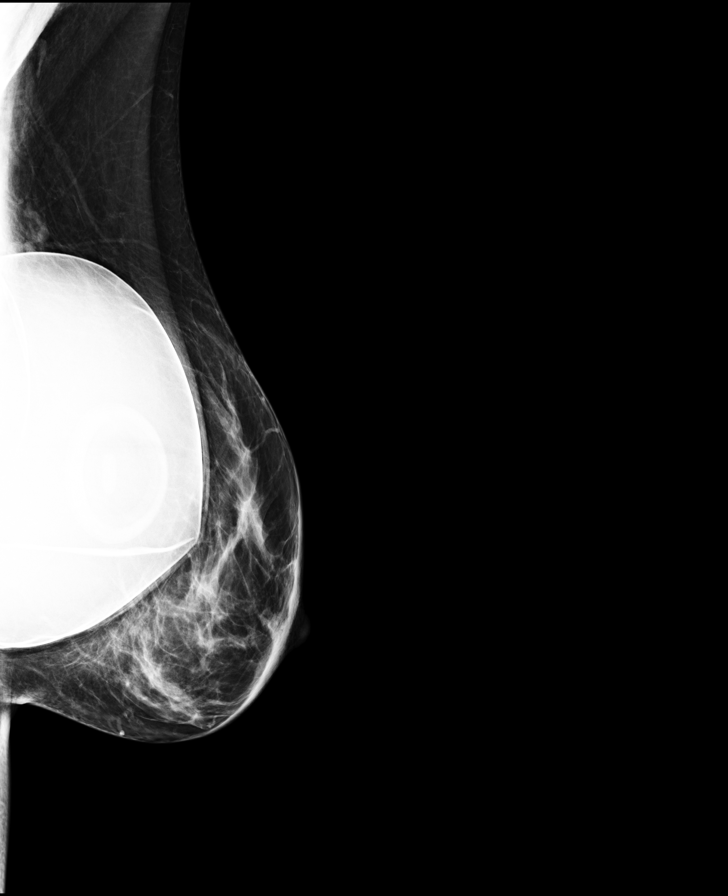

[L CC]
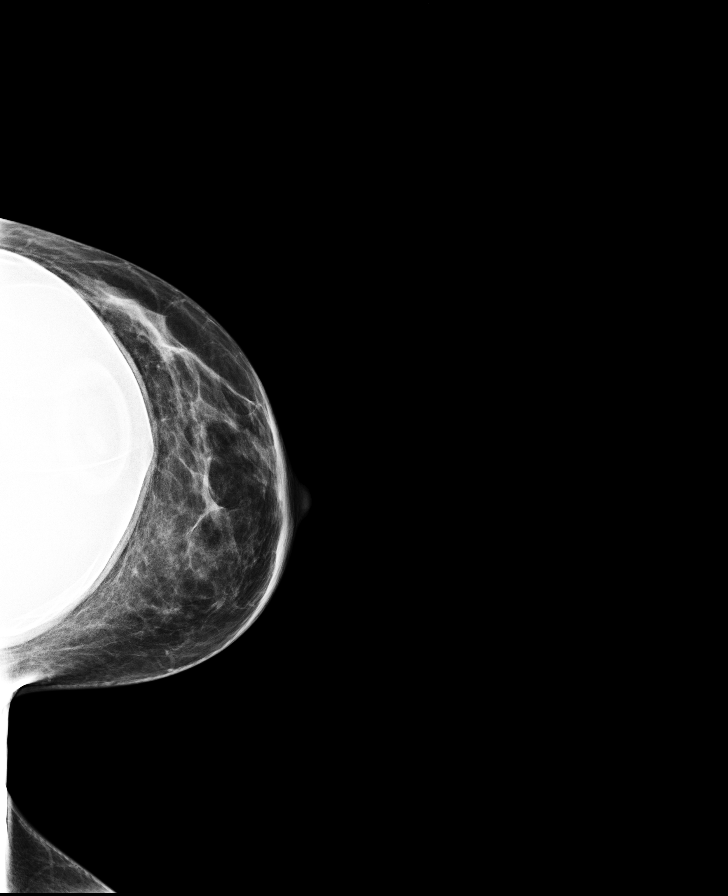

[R CC]
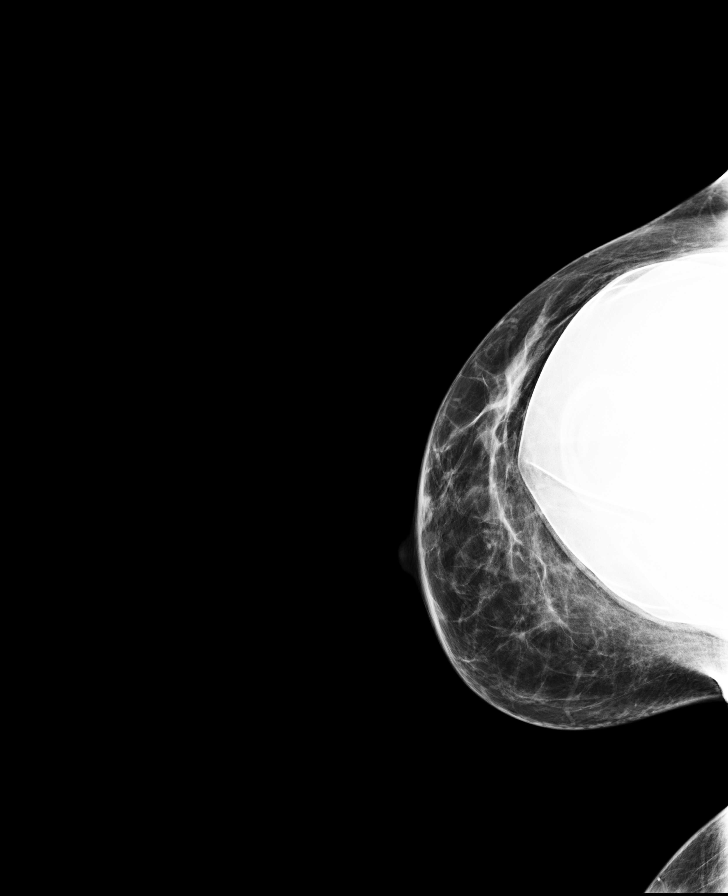

[R MLO]
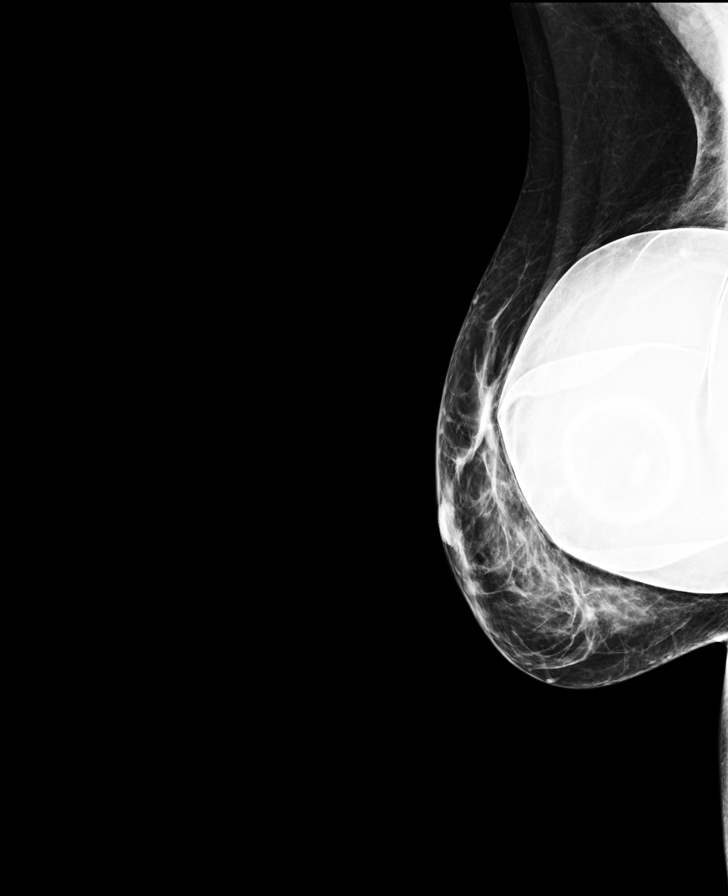

[R CC synth-2D]
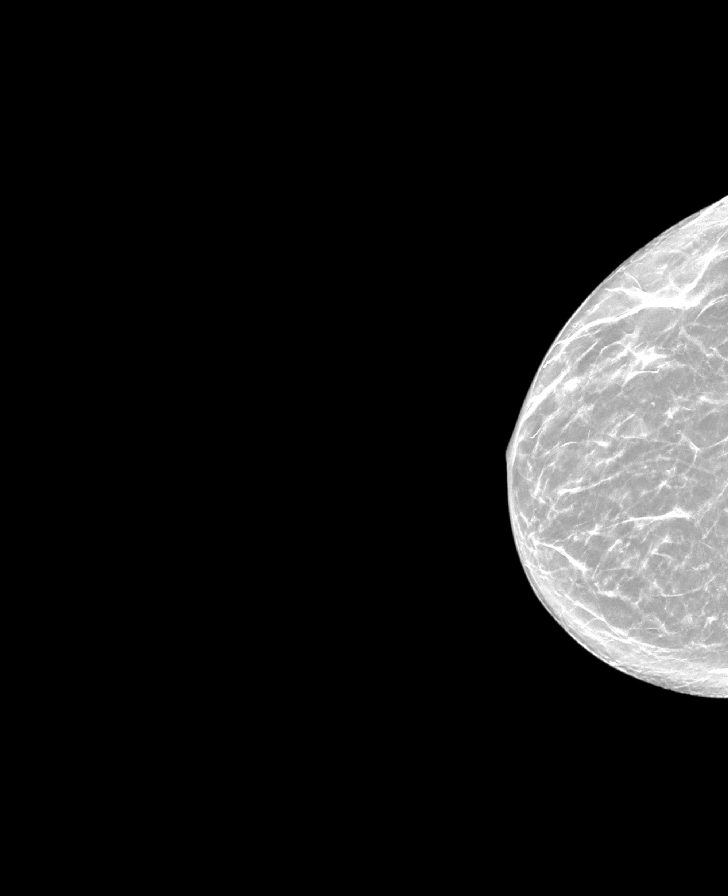

[L CC synth-2D]
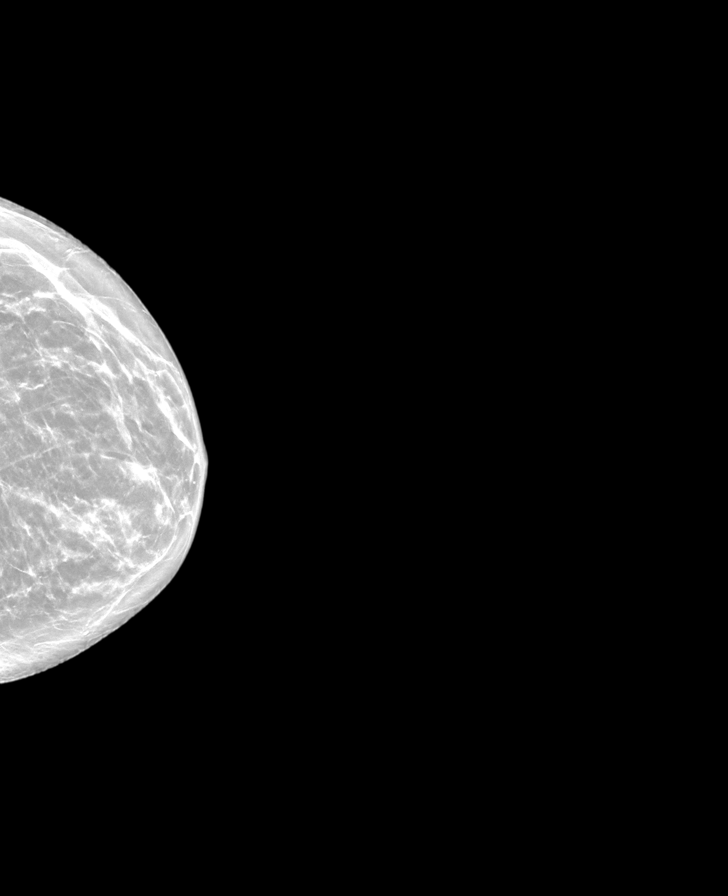

[R MLO synth-2D (1 of 2)]
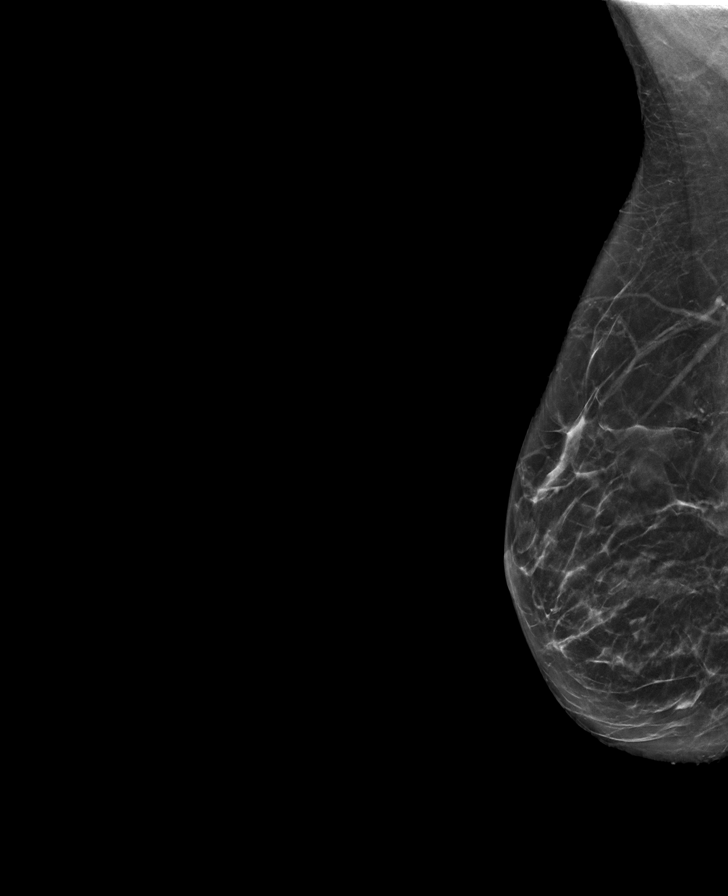

[R MLO synth-2D (2 of 2)]
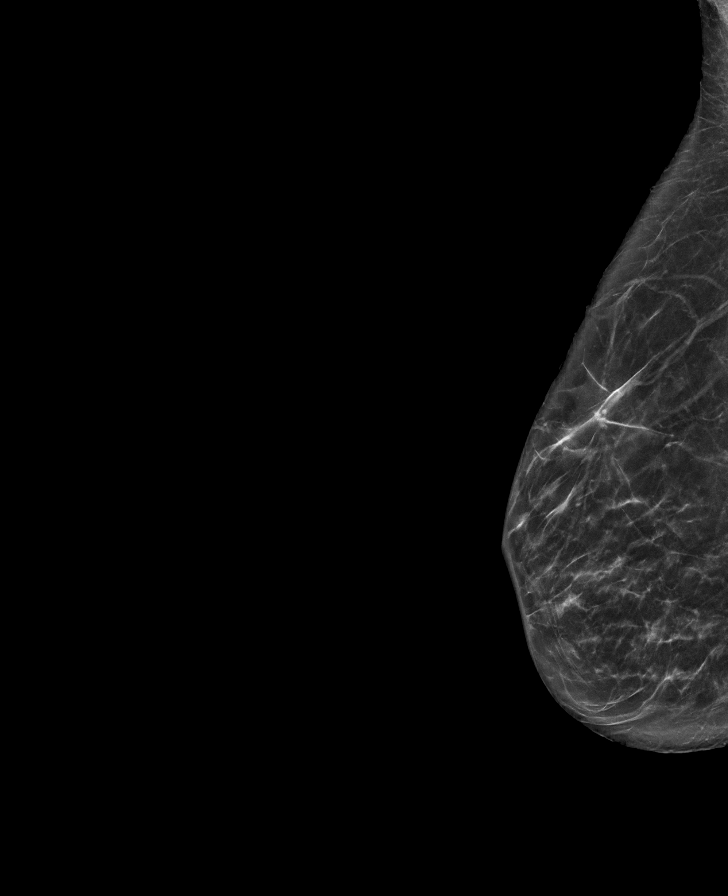

[8 of 34 positions shown; findings below may reference images not displayed]

ACR Breast Density Category b: There are scattered areas of
fibroglandular density.
FINDINGS: The patient has retropectoral saline implants. Standard 2D full
field CC and MLO views of both breasts and tomosynthesis and
synthesized implant displaced CC and MLO views of both breasts were
obtained.

There are no findings suspicious for malignancy. Images were
processed with CAD.
IMPRESSION: No mammographic evidence of malignancy. A result letter of this
screening mammogram will be mailed directly to the patient.

RECOMMENDATION:
Screening mammogram in one year. (Code:DI-[id number])

BI-RADS CATEGORY  1:  Negative.

## 2022-10-22 ENCOUNTER — Other Ambulatory Visit: Payer: Self-pay

## 2022-10-22 DIAGNOSIS — Z1231 Encounter for screening mammogram for malignant neoplasm of breast: Secondary | ICD-10-CM

## 2023-10-28 ENCOUNTER — Ambulatory Visit: Payer: BC Managed Care – PPO

## 2023-10-28 DIAGNOSIS — Z980 Intestinal bypass and anastomosis status: Secondary | ICD-10-CM | POA: Diagnosis not present

## 2023-10-28 DIAGNOSIS — Z08 Encounter for follow-up examination after completed treatment for malignant neoplasm: Secondary | ICD-10-CM | POA: Diagnosis present

## 2023-10-28 DIAGNOSIS — Z85038 Personal history of other malignant neoplasm of large intestine: Secondary | ICD-10-CM | POA: Diagnosis not present

## 2023-11-02 ENCOUNTER — Other Ambulatory Visit: Payer: Self-pay | Admitting: Internal Medicine

## 2023-11-02 DIAGNOSIS — Z1231 Encounter for screening mammogram for malignant neoplasm of breast: Secondary | ICD-10-CM

## 2024-01-01 ENCOUNTER — Emergency Department: Payer: Self-pay

## 2024-01-01 ENCOUNTER — Emergency Department
Admission: EM | Admit: 2024-01-01 | Discharge: 2024-01-01 | Disposition: A | Discharge status: Home / Self Care | Payer: Self-pay | Attending: Emergency Medicine | Admitting: Emergency Medicine

## 2024-01-01 ENCOUNTER — Other Ambulatory Visit: Payer: Self-pay

## 2024-01-01 DIAGNOSIS — R519 Headache, unspecified: Secondary | ICD-10-CM | POA: Diagnosis present

## 2024-01-01 DIAGNOSIS — G43901 Migraine, unspecified, not intractable, with status migrainosus: Secondary | ICD-10-CM | POA: Insufficient documentation

## 2024-01-01 LAB — CBC
HCT: 36.8 % (ref 36.0–46.0)
Hemoglobin: 12.1 g/dL (ref 12.0–15.0)
MCH: 31.5 pg (ref 26.0–34.0)
MCHC: 32.9 g/dL (ref 30.0–36.0)
MCV: 95.8 fL (ref 80.0–100.0)
Platelets: 269 10*3/uL (ref 150–400)
RBC: 3.84 MIL/uL — ABNORMAL LOW (ref 3.87–5.11)
RDW: 12.5 % (ref 11.5–15.5)
WBC: 5 10*3/uL (ref 4.0–10.5)
nRBC: 0 % (ref 0.0–0.2)

## 2024-01-01 LAB — BASIC METABOLIC PANEL WITH GFR
Anion gap: 7 (ref 5–15)
BUN: 12 mg/dL (ref 6–20)
CO2: 25 mmol/L (ref 22–32)
Calcium: 9.2 mg/dL (ref 8.9–10.3)
Chloride: 108 mmol/L (ref 98–111)
Creatinine, Ser: 0.79 mg/dL (ref 0.44–1.00)
GFR, Estimated: >60 mL/min (ref >60)
Glucose, Bld: 102 mg/dL — ABNORMAL HIGH (ref 70–99)
Potassium: 3.4 mmol/L — ABNORMAL LOW (ref 3.5–5.1)
Sodium: 140 mmol/L (ref 135–145)

## 2024-01-01 MED ORDER — ACETAMINOPHEN 500 MG PO TABS
1000.0000 mg | ORAL_TABLET | Freq: Once | ORAL | Status: AC
  Administered 2024-01-01: 1000 mg via ORAL
  Filled 2024-01-01: qty 2

## 2024-01-01 MED ORDER — DIPHENHYDRAMINE HCL 50 MG/ML IJ SOLN
25.0000 mg | INTRAMUSCULAR | Status: AC
  Administered 2024-01-01: 25 mg via INTRAVENOUS
  Filled 2024-01-01: qty 1

## 2024-01-01 MED ORDER — SODIUM CHLORIDE 0.9 % IV BOLUS
1000.0000 mL | Freq: Once | INTRAVENOUS | Status: AC
  Administered 2024-01-01: 1000 mL via INTRAVENOUS

## 2024-01-01 MED ORDER — DEXAMETHASONE SODIUM PHOSPHATE 10 MG/ML IJ SOLN
10.0000 mg | Freq: Once | INTRAMUSCULAR | Status: AC
  Administered 2024-01-01: 10 mg via INTRAVENOUS
  Filled 2024-01-01: qty 1

## 2024-01-01 MED ORDER — KETOROLAC TROMETHAMINE 30 MG/ML IJ SOLN
15.0000 mg | Freq: Once | INTRAMUSCULAR | Status: AC
  Administered 2024-01-01: 15 mg via INTRAVENOUS
  Filled 2024-01-01: qty 1

## 2024-01-01 MED ORDER — MAGNESIUM SULFATE 2 GM/50ML IV SOLN
2.0000 g | Freq: Once | INTRAVENOUS | Status: AC
  Administered 2024-01-01: 2 g via INTRAVENOUS
  Filled 2024-01-01: qty 50

## 2024-01-01 MED ORDER — PROCHLORPERAZINE EDISYLATE 10 MG/2ML IJ SOLN
10.0000 mg | INTRAMUSCULAR | Status: AC
  Administered 2024-01-01: 10 mg via INTRAVENOUS
  Filled 2024-01-01: qty 2

## 2024-01-01 NOTE — Discharge Instructions (Signed)
You have been seen in the Emergency Department (ED) for a migraine.  Please use Tylenol or Motrin as needed for symptoms, but only as written on the box, and take any regular medications that have been prescribed for you. As we have discussed, please follow up with your doctor as soon as possible regarding today's ED visit and your headache symptoms.    Call your doctor or return to the Emergency Department (ED) if you have a worsening headache, sudden and severe headache, confusion, slurred speech, facial droop, weakness or numbness in any arm or leg, extreme fatigue, or other symptoms that concern you.  
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
## 2024-01-01 NOTE — ED Triage Notes (Signed)
 Pt to ED via [facility] c/o headache. Pt says it has been on all day but got worse about 2 hrs ago. Has hx of migraines. Has used fioricet, tylenol , and excedrin with minimal relief. Pt A&Ox4

## 2024-01-05 ENCOUNTER — Ambulatory Visit
Admission: EM | Admit: 2024-01-05 | Discharge: 2024-01-05 | Discharge status: Against Medical Advice | Attending: Family Medicine | Admitting: Family Medicine

## 2024-01-05 ENCOUNTER — Other Ambulatory Visit: Payer: Self-pay

## 2024-01-05 DIAGNOSIS — R519 Headache, unspecified: Secondary | ICD-10-CM | POA: Diagnosis not present

## 2024-01-05 DIAGNOSIS — R079 Chest pain, unspecified: Secondary | ICD-10-CM

## 2024-01-05 DIAGNOSIS — G43819 Other migraine, intractable, without status migrainosus: Secondary | ICD-10-CM

## 2024-01-05 NOTE — ED Notes (Signed)
 Pt refused EMS transportation. She prefers to go to [facility] and EMS advised patient they would transport her to [facility]. At this time she is waiting for her friend Ernestina Headland and states she will arrange her own medical transportation to [place]. AMA formed signed.

## 2024-01-05 NOTE — ED Triage Notes (Signed)
 Pt c/o "worst headache I've ever had", light sensitivity, nausea, Chest pain

## 2024-01-05 NOTE — ED Triage Notes (Signed)
 Pt states that she has been taking tylenol  extra strength and excedrin migrain.  Pt states that she was seen last week and given a cocktail for headaches.  Pt c/o "worst headache I've ever had", light sensitivity, nausea, Chest pain x1day  Pt is having 10/10 headache with 7/10 chest pain  Pt states that she has a new rash on her chest that started yesterday.

## 2024-01-05 NOTE — ED Provider Notes (Signed)
 [facility]    CSN: [id number] Arrival date & time: 01/05/24  8657      History   Chief Complaint Chief Complaint  Patient presents with   Headache    HPI Carla King is a 58 y.o. female presents for evaluation of headache and chest pain.  Patient has a past medical history of migraines, anxiety, colon cancer who presents for headache.  She reports around 6 AM this morning she developed a headache that she describes as a vice/pressure around her temples.  She currently rates it as a 10 out of 10 and does state it is the worst headache of her life.  It is associated with photophobia, nausea, dizziness, near syncope.  She also reports while in the waiting room to be seen today she developed a midsternal chest pain that radiates up into her throat.  She denies any shortness of breath.  Denies any past medical history of cardiac history.  She does vape tobacco and currently smokes marijuana medicinally.  Reports a past medical family history of CAD with her grandfather.  She does states she is in remission for her colon cancer.  She has taken 1500 mg of Tylenol  and 800 mg of ibuprofen for her headache this morning without improvement.  She was seen in in the emergency room 4 days ago for headache as well.  She did have a negative CT of her head and was treated with ketorolac  dexamethasone  and magnesium .  Patient states her headache resolved and she was discharged home.   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      Home Medications    Prior to Admission medications   Medication Sig Start Date End Date Taking? Authorizing Provider  amitriptyline (ELAVIL) 25 MG tablet Take by mouth.   Yes [provider]  amphetamine-dextroamphetamine (ADDERALL XR) 15 MG 24 hr capsule Take by mouth every morning.   Yes [provider]  butalbital-acetaminophen -caffeine (FIORICET) 50-325-40 MG tablet Take by mouth.   Yes [provider]  clonazePAM  (KLONOPIN ) 0.5 MG tablet Take 1 tablet (0.5 mg total) by mouth daily as needed for anxiety. 04/02/13  Yes Tullo, Teresa L, MD  PARoxetine (PAXIL) 20 MG tablet Take 1 tablet by mouth 2 (two) times daily. 10/20/23  Yes [provider]  traZODone  (DESYREL ) 100 MG tablet Take 1 tablet (100 mg total) by mouth at bedtime. 03/09/13  Yes Thersia Flax, MD  zolpidem  (AMBIEN ) 10 MG tablet Take 1 tablet (10 mg total) by mouth at bedtime as needed for sleep. 02/18/12 03/19/12  Thersia Flax, MD    Family History History reviewed. No pertinent family history.  Social History Social History   Tobacco Use   Smoking status: Never   Smokeless tobacco: Never  Vaping Use   Vaping status: Every Day  Substance Use Topics   Alcohol use: No  Drug use: Yes    Types: Marijuana     Allergies   Patient has no known allergies.   Review of Systems Review of Systems  Eyes:  Positive for photophobia.  Gastrointestinal:  Positive for nausea and vomiting.  Neurological:  Positive for dizziness and headaches.       Near syncope  Psychiatric/Behavioral:  The patient is nervous/anxious.      Physical Exam Triage Vital Signs ED Triage Vitals  Encounter Vitals Group     BP 01/05/24 1026 119/81     Systolic BP Percentile --      Diastolic BP Percentile --      Pulse Rate 01/05/24 1026 87     Resp --      Temp 01/05/24 1026 (!) 97.3 F (36.3 C)      Temp Source 01/05/24 1026 Temporal     SpO2 01/05/24 1026 99 %     Weight 01/05/24 1027 150 lb (68 kg)     Height 01/05/24 1027 5\' 5"  (1.651 m)     Head Circumference --      Peak Flow --      Pain Score 01/05/24 1027 10     Pain Loc --      Pain Education --      Exclude from Growth Chart --    No data found.  Updated Vital Signs BP 119/81 (BP Location: Left Arm)   Pulse 87   Temp (!) 97.3 F (36.3 C) (Temporal)   Ht 5\' 5"  (1.651 m)   Wt 150 lb (68 kg)   LMP 01/28/2015   SpO2 99%   BMI 24.96 kg/m   Visual Acuity Right Eye Distance:   Left Eye Distance:   Bilateral Distance:    Right Eye Near:   Left Eye Near:    Bilateral Near:     Physical Exam Vitals and nursing note reviewed.  Constitutional:      General: She is not in acute distress.    Comments: Pt crying intermittently throughout H&P  HENT:     Head: Normocephalic and atraumatic.  Eyes:     Extraocular Movements: Extraocular movements intact.     Conjunctiva/sclera: Conjunctivae normal.     Pupils: Pupils are equal, round, and reactive to light.  Cardiovascular:     Rate and Rhythm: Normal rate and regular rhythm.     Heart sounds: Normal heart sounds.  Pulmonary:     Effort: Pulmonary effort is normal.     Breath sounds: Normal breath sounds.  Chest:     Chest wall: Tenderness present.  Skin:    General: Skin is warm and dry.  Neurological:     General: No focal deficit present.     Mental Status: She is alert and oriented to person, place, and time.     GCS: GCS eye subscore is 4. GCS verbal subscore is 5. GCS motor subscore is 6.     Cranial Nerves: No facial asymmetry.     Motor: No weakness.     Comments: Patient laying down and unable to stand to check gait or coordination  Psychiatric:     Comments: Patient anxious and crying throughout H&P/exam      UC Treatments / Results  Labs (all labs ordered are listed, but only abnormal results are displayed) Labs Reviewed - No data to  display  EKG EKG shows sinus rhythm heart rate of 85 with no acute ST changes.  Nonspecific T wave changes.  Radiology No  results found.  Procedures Procedures (including critical care time)  Medications Ordered in UC Medications - No data to display  Initial Impression / Assessment and Plan / UC Course  I have reviewed the triage vital signs and the nursing notes.  Pertinent labs & imaging results that were available during my care of the patient were reviewed by me and considered in my medical decision making (see chart for details).     Reviewed exam and symptoms with patient.  Discussed limitations and abilities of urgent care.  Given worse headache of life with chest pain advise she go EMS to the emergency room for further evaluation.  Patient states she refused to go to [facility] emergency room and wants to go to [facility] emergency room.  Advised that we will between her and EMS when they arrive.  EMS arrived she was informed they would not transport her to the [facility] emergency room she then declined EMS transfer and states she will have a friend come pick her up and take her to the emergency room.  I discussed risk of going POV given her current symptoms including heart attack stroke, permanent disability, and/or death and she verbalized understanding.  AMA form was signed.  Patient was taken POV by her friend to the emergency room.  I did instruct her to pull over and call 911 if anything worsens or changes within transit and she verbalized understanding. Final Clinical Impressions(s) / UC Diagnoses   Final diagnoses:  Worst headache of life  Other migraine without status migrainosus, intractable  Chest pain, unspecified type   Discharge Instructions   None    ED Prescriptions   None    PDMP not reviewed this encounter.   Alleen Arbour, NP 01/05/24 1105

## 2024-01-05 NOTE — ED Notes (Signed)
 EMS called for patient transport.

## 2024-01-05 NOTE — ED Notes (Signed)
 Patient is being discharged from the Urgent Care and sent to the Emergency Department via EMS . Per Monta Anton, NP, patient is in need of higher level of care due to Headache and Chest Pain. Patient is aware and verbalizes understanding of plan of care.  Vitals:   01/05/24 1026  BP: 119/81  Pulse: 87  Temp: (!) 97.3 F (36.3 C)  SpO2: 99%
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
## 2024-01-05 NOTE — Discharge Instructions (Addendum)
 Patient declined EMS/sign AMA and went POV to the emergency room

## 2024-02-02 ENCOUNTER — Other Ambulatory Visit: Payer: Self-pay | Admitting: Internal Medicine

## 2024-02-02 DIAGNOSIS — R4182 Altered mental status, unspecified: Secondary | ICD-10-CM

## 2024-02-03 ENCOUNTER — Emergency Department (EMERGENCY_DEPARTMENT_HOSPITAL)
Admission: EM | Admit: 2024-02-03 | Discharge: 2024-02-04 | Disposition: A | Discharge status: Other Acute Inpatient Hospital | Payer: Self-pay | Source: Home / Self Care | Attending: Emergency Medicine | Admitting: Emergency Medicine

## 2024-02-03 ENCOUNTER — Other Ambulatory Visit: Payer: Self-pay

## 2024-02-03 DIAGNOSIS — F1090 Alcohol use, unspecified, uncomplicated: Secondary | ICD-10-CM | POA: Insufficient documentation

## 2024-02-03 DIAGNOSIS — F39 Unspecified mood [affective] disorder: Secondary | ICD-10-CM | POA: Insufficient documentation

## 2024-02-03 DIAGNOSIS — F29 Unspecified psychosis not due to a substance or known physiological condition: Secondary | ICD-10-CM | POA: Insufficient documentation

## 2024-02-03 DIAGNOSIS — F1210 Cannabis abuse, uncomplicated: Secondary | ICD-10-CM

## 2024-02-03 DIAGNOSIS — R45851 Suicidal ideations: Secondary | ICD-10-CM | POA: Insufficient documentation

## 2024-02-03 DIAGNOSIS — F1290 Cannabis use, unspecified, uncomplicated: Secondary | ICD-10-CM | POA: Insufficient documentation

## 2024-02-03 DIAGNOSIS — F1091 Alcohol use, unspecified, in remission: Secondary | ICD-10-CM

## 2024-02-03 DIAGNOSIS — F419 Anxiety disorder, unspecified: Secondary | ICD-10-CM | POA: Diagnosis present

## 2024-02-03 DIAGNOSIS — F32A Depression, unspecified: Secondary | ICD-10-CM | POA: Insufficient documentation

## 2024-02-03 DIAGNOSIS — F319 Bipolar disorder, unspecified: Secondary | ICD-10-CM | POA: Diagnosis present

## 2024-02-03 DIAGNOSIS — F1620 Hallucinogen dependence, uncomplicated: Secondary | ICD-10-CM | POA: Diagnosis not present

## 2024-02-03 DIAGNOSIS — Z79899 Other long term (current) drug therapy: Secondary | ICD-10-CM | POA: Insufficient documentation

## 2024-02-03 LAB — ACETAMINOPHEN LEVEL: Acetaminophen (Tylenol), Serum: <10 ug/mL — ABNORMAL LOW (ref 10–30)

## 2024-02-03 LAB — CBC
HCT: 37.3 % (ref 36.0–46.0)
Hemoglobin: 12.6 g/dL (ref 12.0–15.0)
MCH: 31.7 pg (ref 26.0–34.0)
MCHC: 33.8 g/dL (ref 30.0–36.0)
MCV: 94 fL (ref 80.0–100.0)
Platelets: 271 10*3/uL (ref 150–400)
RBC: 3.97 MIL/uL (ref 3.87–5.11)
RDW: 13 % (ref 11.5–15.5)
WBC: 6.1 10*3/uL (ref 4.0–10.5)
nRBC: 0 % (ref 0.0–0.2)

## 2024-02-03 LAB — COMPREHENSIVE METABOLIC PANEL WITH GFR
ALT: 24 U/L (ref 0–44)
AST: 27 U/L (ref 15–41)
Albumin: 4.2 g/dL (ref 3.5–5.0)
Alkaline Phosphatase: 66 U/L (ref 38–126)
Anion gap: 9 (ref 5–15)
BUN: 15 mg/dL (ref 6–20)
CO2: 22 mmol/L (ref 22–32)
Calcium: 9.1 mg/dL (ref 8.9–10.3)
Chloride: 104 mmol/L (ref 98–111)
Creatinine, Ser: 0.55 mg/dL (ref 0.44–1.00)
GFR, Estimated: >60 mL/min (ref >60)
Glucose, Bld: 107 mg/dL — ABNORMAL HIGH (ref 70–99)
Potassium: 4 mmol/L (ref 3.5–5.1)
Sodium: 135 mmol/L (ref 135–145)
Total Bilirubin: 0.6 mg/dL (ref 0.0–1.2)
Total Protein: 7.2 g/dL (ref 6.5–8.1)

## 2024-02-03 LAB — ETHANOL: Alcohol, Ethyl (B): <15 mg/dL (ref <15)

## 2024-02-03 LAB — SALICYLATE LEVEL: Salicylate Lvl: <7 mg/dL — ABNORMAL LOW (ref 7.0–30.0)

## 2024-02-03 MED ORDER — ZIPRASIDONE HCL 20 MG PO CAPS: 20.0000 mg | ORAL_CAPSULE | Freq: Two times a day (BID) | ORAL | Status: DC

## 2024-02-03 MED ORDER — MAGNESIUM OXIDE -MG SUPPLEMENT 400 (240 MG) MG PO TABS
800.0000 mg | ORAL_TABLET | Freq: Every day | ORAL | Status: DC
  Administered 2024-02-03: 800 mg via ORAL
  Filled 2024-02-03: qty 2

## 2024-02-03 MED ORDER — CLONAZEPAM 0.5 MG PO TABS
0.5000 mg | ORAL_TABLET | Freq: Every day | ORAL | Status: DC | PRN
  Administered 2024-02-03: 0.5 mg via ORAL
  Filled 2024-02-03: qty 1

## 2024-02-03 MED ORDER — PROCHLORPERAZINE MALEATE 5 MG PO TABS: 5.0000 mg | ORAL_TABLET | Freq: Four times a day (QID) | ORAL | Status: DC | PRN

## 2024-02-03 MED ORDER — ACETAMINOPHEN 500 MG PO TABS
1000.0000 mg | ORAL_TABLET | Freq: Once | ORAL | Status: AC
Start: 2024-02-03 — End: 2024-02-03
  Administered 2024-02-03: 1000 mg via ORAL
  Filled 2024-02-03: qty 2

## 2024-02-03 MED ORDER — TRAZODONE HCL 100 MG PO TABS: 100.0000 mg | ORAL_TABLET | Freq: Every day | ORAL | Status: DC

## 2024-02-03 MED ORDER — AMITRIPTYLINE HCL 25 MG PO TABS
25.0000 mg | ORAL_TABLET | Freq: Two times a day (BID) | ORAL | Status: DC
  Administered 2024-02-03: 25 mg via ORAL
  Filled 2024-02-03: qty 1

## 2024-02-03 MED ORDER — CYCLOBENZAPRINE HCL 10 MG PO TABS
5.0000 mg | ORAL_TABLET | Freq: Three times a day (TID) | ORAL | Status: DC | PRN
  Administered 2024-02-03: 5 mg via ORAL
  Filled 2024-02-03: qty 1

## 2024-02-03 MED ORDER — ONDANSETRON 4 MG PO TBDP
4.0000 mg | ORAL_TABLET | Freq: Once | ORAL | Status: AC
  Administered 2024-02-03: 4 mg via ORAL
  Filled 2024-02-03: qty 1

## 2024-02-03 MED ORDER — LIDOCAINE 5 % EX PTCH
1.0000 | MEDICATED_PATCH | CUTANEOUS | Status: DC
  Administered 2024-02-03: 1 via TRANSDERMAL
  Filled 2024-02-03: qty 1

## 2024-02-03 MED ORDER — PAROXETINE HCL 20 MG PO TABS
20.0000 mg | ORAL_TABLET | Freq: Every day | ORAL | Status: DC
  Administered 2024-02-03: 20 mg via ORAL
  Filled 2024-02-03: qty 1

## 2024-02-03 MED ORDER — PAROXETINE HCL 10 MG PO TABS
10.0000 mg | ORAL_TABLET | Freq: Every day | ORAL | Status: DC
Start: 2024-02-04 — End: 2024-02-04

## 2024-02-03 MED ORDER — LORAZEPAM 2 MG PO TABS
2.0000 mg | ORAL_TABLET | Freq: Once | ORAL | Status: AC
Start: 2024-02-03 — End: 2024-02-03
  Administered 2024-02-03: 2 mg via ORAL
  Filled 2024-02-03: qty 1

## 2024-02-03 MED ORDER — BUTALBITAL-APAP-CAFFEINE 50-325-40 MG PO TABS
1.0000 | ORAL_TABLET | Freq: Three times a day (TID) | ORAL | Status: DC | PRN
  Administered 2024-02-03: 1 via ORAL
  Filled 2024-02-03: qty 1

## 2024-02-03 NOTE — ED Provider Notes (Signed)
[facility] Provider Note    Event Date/Time   First MD Initiated Contact with Patient 02/03/24 1813     (approximate)  History   Chief Complaint: Suicidal ideation  HPI  Carla King is a 58 y.o. female with a past medical history of alcohol abuse, bipolar, presents to the emergency department under IVC's for reported suicidal ideation.  According to the IVC the son took out on the patient she had threatened to kill herself earlier.  Patient states she has attempted to kill herself in the past but denies any attempt or thoughts of killing herself today.  Patient has a rapid BH, she continues to get up out of bed at times refusing to sit back in the bed.  Patient is willing to take something orally to help her relax she is also somewhat redirectable at this time.  Physical Exam   Triage Vital Signs: ED Triage Vitals  Encounter Vitals Group     BP 02/03/24 1726 118/77     Systolic BP Percentile --      Diastolic BP Percentile --      Pulse Rate 02/03/24 1726 100     Resp 02/03/24 1726 17     Temp 02/03/24 1726 98 F (36.7 C)     Temp Source 02/03/24 1726 Oral     SpO2 02/03/24 1726 100 %     Weight 02/03/24 1801 148 lb (67.1 kg)     Height 02/03/24 1801 5\' 5"  (1.651 m)     Head Circumference --      Peak Flow --      Pain Score 02/03/24 1800 5     Pain Loc --      Pain Education --      Exclude from Growth Chart --     Most recent vital signs: Vitals:   02/03/24 1726  BP: 118/77  Pulse: 100  Resp: 17  Temp: 98 F (36.7 C)  SpO2: 100%    General: Awake, no distress.  CV:  Good peripheral perfusion.  Regular rate and rhythm  Resp:  Normal effort.  Equal breath sounds bilaterally.  Abd:  No distention.  Soft, nontender.  No rebound or guarding.  ED Results / Procedures / Treatments   MEDICATIONS ORDERED IN ED: Medications  LORazepam (ATIVAN) tablet 2 mg (2 mg Oral Given 02/03/24 1847)     IMPRESSION / MDM / ASSESSMENT AND PLAN /  ED COURSE  I reviewed the triage vital signs and the nursing notes.  Patient's presentation is most consistent with acute presentation with potential threat to life or bodily function.  Patient presents under IVC for reported suicidal ideation.  Given the patient's behavior in the emergency department and reported suicidal ideation we will maintain the IVC until psychiatry can adequately evaluate.  Patient denies any suicidal ideation.  Patient is somewhat restless continues to get out of bed and walk on the hallway she is at this time redirectable.  She is willing to take something orally to help her relax and calm down.  She has rapid speech.  We will dose 2 mg of oral Ativan.  Patient's workup today shows a reassuring CBC reassuring chemistry negative alcohol.  Remainder the lab work is pending.  Psychiatry and TTS evaluations pending.  FINAL CLINICAL IMPRESSION(S) / ED DIAGNOSES   Suicidal ideation    Note:  This document was prepared using Dragon voice recognition software and may include unintentional dictation errors.   Ruth Cove, MD  02/03/24 1930  

## 2024-02-03 NOTE — ED Notes (Signed)
Pt given phone to call family.

## 2024-02-03 NOTE — ED Notes (Signed)
 Pt on phone with family member loudly reporting to person on phone, "I am not staying overnight even though I'm IVC and jokingly stating I can make a run for it.

## 2024-02-03 NOTE — ED Notes (Addendum)
 Patient requesting female nurse to triage pt.
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
## 2024-02-03 NOTE — ED Triage Notes (Signed)
 Pt comes in via [facility] after her son took out IVC paperwork on her after she admitted to him that he wanted to kill herself. Pt was recently diagnosed  with cancer. Pt has a history of bipolar, and is not currently taking her medication. Pt has no endorsements of SI/HI at this time. Pt has complaints of wrist pain and hand tingling, and left shoulder pain. Pt also complains of head and neck pain.

## 2024-02-03 NOTE — ED Notes (Signed)
 IVC PENDING  CONSULT ?

## 2024-02-03 NOTE — ED Notes (Signed)
 Pt dressed out into hospital provided scrubs. Belongings placed into bag with pt Id. Pink Belt  6 sliver rings Blue sunglasses  Purple socks  Purple compression socks  Necklace  2 ear rings mix match Jeans  Purple top Black bathing suit Hair tie Dog print shoes
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
## 2024-02-03 NOTE — ED Notes (Signed)
 Pt up to counter attempting to grab beverages from counter. Security and RN explaining to pt she can have water at this time.

## 2024-02-04 ENCOUNTER — Encounter: Payer: Self-pay | Admitting: Psychiatry

## 2024-02-04 ENCOUNTER — Inpatient Hospital Stay
Admission: AD | Admit: 2024-02-04 | Discharge: 2024-02-08 | DRG: 885 | Disposition: A | Discharge status: Home / Self Care | Payer: Self-pay | Source: Intra-hospital | Attending: Psychiatry | Admitting: Psychiatry

## 2024-02-04 DIAGNOSIS — Z85038 Personal history of other malignant neoplasm of large intestine: Secondary | ICD-10-CM

## 2024-02-04 DIAGNOSIS — Z91148 Patient's other noncompliance with medication regimen for other reason: Secondary | ICD-10-CM

## 2024-02-04 DIAGNOSIS — F3132 Bipolar disorder, current episode depressed, moderate: Secondary | ICD-10-CM

## 2024-02-04 DIAGNOSIS — G35 Multiple sclerosis: Secondary | ICD-10-CM | POA: Diagnosis present

## 2024-02-04 DIAGNOSIS — Z604 Social exclusion and rejection: Secondary | ICD-10-CM | POA: Diagnosis present

## 2024-02-04 DIAGNOSIS — F429 Obsessive-compulsive disorder, unspecified: Secondary | ICD-10-CM | POA: Diagnosis present

## 2024-02-04 DIAGNOSIS — Z818 Family history of other mental and behavioral disorders: Secondary | ICD-10-CM

## 2024-02-04 DIAGNOSIS — Z62819 Personal history of unspecified abuse in childhood: Secondary | ICD-10-CM

## 2024-02-04 DIAGNOSIS — F4310 Post-traumatic stress disorder, unspecified: Secondary | ICD-10-CM | POA: Diagnosis present

## 2024-02-04 DIAGNOSIS — F1010 Alcohol abuse, uncomplicated: Secondary | ICD-10-CM | POA: Diagnosis present

## 2024-02-04 DIAGNOSIS — K5900 Constipation, unspecified: Secondary | ICD-10-CM | POA: Diagnosis present

## 2024-02-04 DIAGNOSIS — F319 Bipolar disorder, unspecified: Secondary | ICD-10-CM | POA: Diagnosis present

## 2024-02-04 DIAGNOSIS — Z79899 Other long term (current) drug therapy: Secondary | ICD-10-CM

## 2024-02-04 DIAGNOSIS — F1210 Cannabis abuse, uncomplicated: Secondary | ICD-10-CM | POA: Diagnosis present

## 2024-02-04 DIAGNOSIS — R45851 Suicidal ideations: Secondary | ICD-10-CM | POA: Diagnosis present

## 2024-02-04 DIAGNOSIS — F17290 Nicotine dependence, other tobacco product, uncomplicated: Secondary | ICD-10-CM | POA: Diagnosis present

## 2024-02-04 DIAGNOSIS — Z820 Family history of epilepsy and other diseases of the nervous system: Secondary | ICD-10-CM

## 2024-02-04 DIAGNOSIS — Z888 Allergy status to other drugs, medicaments and biological substances status: Secondary | ICD-10-CM

## 2024-02-04 DIAGNOSIS — M549 Dorsalgia, unspecified: Secondary | ICD-10-CM | POA: Diagnosis present

## 2024-02-04 DIAGNOSIS — Z5941 Food insecurity: Secondary | ICD-10-CM

## 2024-02-04 DIAGNOSIS — F419 Anxiety disorder, unspecified: Secondary | ICD-10-CM | POA: Diagnosis present

## 2024-02-04 DIAGNOSIS — Z9049 Acquired absence of other specified parts of digestive tract: Secondary | ICD-10-CM

## 2024-02-04 DIAGNOSIS — Z9151 Personal history of suicidal behavior: Secondary | ICD-10-CM

## 2024-02-04 LAB — URINE DRUG SCREEN, QUALITATIVE (ARMC ONLY)
Amphetamines, Ur Screen: NOT DETECTED
Barbiturates, Ur Screen: POSITIVE — AB
Benzodiazepine, Ur Scrn: POSITIVE — AB
Cannabinoid 50 Ng, Ur ~~LOC~~: POSITIVE — AB
Cocaine Metabolite,Ur ~~LOC~~: NOT DETECTED
MDMA (Ecstasy)Ur Screen: NOT DETECTED
Methadone Scn, Ur: NOT DETECTED
Opiate, Ur Screen: NOT DETECTED
Phencyclidine (PCP) Ur S: NOT DETECTED
Tricyclic, Ur Screen: POSITIVE — AB

## 2024-02-04 MED ORDER — OLANZAPINE 10 MG IM SOLR: 10.0000 mg | Freq: Three times a day (TID) | INTRAMUSCULAR | Status: DC | PRN

## 2024-02-04 MED ORDER — MAGNESIUM OXIDE 400 MG PO TABS: 400.0000 mg | ORAL_TABLET | Freq: Two times a day (BID) | ORAL | Status: DC

## 2024-02-04 MED ORDER — THIAMINE MONONITRATE 100 MG PO TABS
100.0000 mg | ORAL_TABLET | Freq: Every day | ORAL | Status: DC
  Administered 2024-02-05 – 2024-02-08 (×4): 100 mg via ORAL
  Filled 2024-02-04 (×4): qty 1

## 2024-02-04 MED ORDER — LORAZEPAM 1 MG PO TABS: 1.0000 mg | ORAL_TABLET | Freq: Four times a day (QID) | ORAL | Status: AC | PRN

## 2024-02-04 MED ORDER — LOPERAMIDE HCL 2 MG PO CAPS: 2.0000 mg | ORAL_CAPSULE | ORAL | Status: AC | PRN

## 2024-02-04 MED ORDER — ONDANSETRON 4 MG PO TBDP
4.0000 mg | ORAL_TABLET | Freq: Four times a day (QID) | ORAL | Status: AC | PRN
  Administered 2024-02-04 – 2024-02-06 (×6): 4 mg via ORAL
  Filled 2024-02-04 (×6): qty 1

## 2024-02-04 MED ORDER — CLONAZEPAM 0.5 MG PO TABS
0.5000 mg | ORAL_TABLET | Freq: Three times a day (TID) | ORAL | Status: DC | PRN
  Administered 2024-02-04 – 2024-02-08 (×5): 0.5 mg via ORAL
  Filled 2024-02-04 (×5): qty 1

## 2024-02-04 MED ORDER — IBUPROFEN 200 MG PO TABS
200.0000 mg | ORAL_TABLET | Freq: Four times a day (QID) | ORAL | Status: DC | PRN
  Administered 2024-02-04 – 2024-02-05 (×3): 200 mg via ORAL
  Filled 2024-02-04 (×3): qty 1

## 2024-02-04 MED ORDER — HYDROXYZINE HCL 25 MG PO TABS: 25.0000 mg | ORAL_TABLET | Freq: Four times a day (QID) | ORAL | Status: AC | PRN

## 2024-02-04 MED ORDER — MAGNESIUM OXIDE 400 MG PO TABS
400.0000 mg | ORAL_TABLET | Freq: Two times a day (BID) | ORAL | Status: DC
  Administered 2024-02-04 – 2024-02-07 (×7): 400 mg via ORAL
  Filled 2024-02-04 (×9): qty 1

## 2024-02-04 MED ORDER — ADULT MULTIVITAMIN W/MINERALS CH
1.0000 | ORAL_TABLET | Freq: Every day | ORAL | Status: DC
  Administered 2024-02-04 – 2024-02-07 (×4): 1 via ORAL
  Filled 2024-02-04 (×5): qty 1

## 2024-02-04 MED ORDER — OLANZAPINE 10 MG IM SOLR: 5.0000 mg | Freq: Three times a day (TID) | INTRAMUSCULAR | Status: DC | PRN

## 2024-02-04 MED ORDER — SIMETHICONE 80 MG PO CHEW
80.0000 mg | CHEWABLE_TABLET | Freq: Two times a day (BID) | ORAL | Status: DC | PRN
  Administered 2024-02-04 – 2024-02-07 (×6): 80 mg via ORAL
  Filled 2024-02-04 (×7): qty 1

## 2024-02-04 MED ORDER — ONDANSETRON 4 MG PO TBDP: 4.0000 mg | ORAL_TABLET | Freq: Once | ORAL | Status: DC

## 2024-02-04 MED ORDER — THIAMINE HCL 100 MG/ML IJ SOLN
100.0000 mg | Freq: Once | INTRAMUSCULAR | Status: AC
  Administered 2024-02-04: 100 mg via INTRAMUSCULAR
  Filled 2024-02-04: qty 2

## 2024-02-04 MED ORDER — NICOTINE 14 MG/24HR TD PT24
14.0000 mg | MEDICATED_PATCH | Freq: Every day | TRANSDERMAL | Status: DC
  Administered 2024-02-04 – 2024-02-08 (×5): 14 mg via TRANSDERMAL
  Filled 2024-02-04 (×4): qty 1

## 2024-02-04 MED ORDER — ACETAMINOPHEN 325 MG PO TABS: 650.0000 mg | ORAL_TABLET | Freq: Four times a day (QID) | ORAL | Status: DC | PRN

## 2024-02-04 MED ORDER — OLANZAPINE 5 MG PO TBDP: 5.0000 mg | ORAL_TABLET | Freq: Three times a day (TID) | ORAL | Status: DC | PRN

## 2024-02-04 MED ORDER — OLANZAPINE 10 MG PO TBDP: 5.0000 mg | ORAL_TABLET | Freq: Three times a day (TID) | ORAL | Status: DC | PRN

## 2024-02-04 MED ORDER — TRAZODONE HCL 50 MG PO TABS
50.0000 mg | ORAL_TABLET | Freq: Every evening | ORAL | Status: DC | PRN
  Administered 2024-02-07: 50 mg via ORAL
  Filled 2024-02-04: qty 1

## 2024-02-04 MED ORDER — BUTALBITAL-APAP-CAFFEINE 50-325-40 MG PO TABS
1.0000 | ORAL_TABLET | ORAL | Status: AC
  Administered 2024-02-04: 1 via ORAL
  Filled 2024-02-04: qty 1

## 2024-02-04 MED ORDER — PALIPERIDONE ER 3 MG PO TB24
6.0000 mg | ORAL_TABLET | Freq: Every day | ORAL | Status: DC
  Administered 2024-02-05 – 2024-02-08 (×4): 6 mg via ORAL
  Filled 2024-02-04 (×4): qty 2

## 2024-02-04 MED ORDER — LITHIUM CARBONATE ER 450 MG PO TBCR
450.0000 mg | EXTENDED_RELEASE_TABLET | Freq: Two times a day (BID) | ORAL | Status: DC
  Administered 2024-02-04 – 2024-02-08 (×8): 450 mg via ORAL
  Filled 2024-02-04 (×8): qty 1

## 2024-02-04 MED ORDER — TRAZODONE HCL 50 MG PO TABS: 50.0000 mg | ORAL_TABLET | Freq: Every evening | ORAL | Status: DC | PRN

## 2024-02-04 MED ORDER — PALIPERIDONE ER 3 MG PO TB24
3.0000 mg | ORAL_TABLET | ORAL | Status: AC
  Administered 2024-02-04: 3 mg via ORAL
  Filled 2024-02-04: qty 1

## 2024-02-04 MED ORDER — HYDROXYZINE HCL 25 MG PO TABS: 25.0000 mg | ORAL_TABLET | Freq: Three times a day (TID) | ORAL | Status: DC | PRN

## 2024-02-04 MED ORDER — NICOTINE 7 MG/24HR TD PT24
7.0000 mg | MEDICATED_PATCH | Freq: Every day | TRANSDERMAL | Status: DC
  Filled 2024-02-04: qty 1

## 2024-02-04 MED ORDER — MAGNESIUM OXIDE 400 MG PO TABS: 400.0000 mg | ORAL_TABLET | ORAL | Status: DC

## 2024-02-04 MED ORDER — ACETAMINOPHEN 325 MG PO TABS
650.0000 mg | ORAL_TABLET | Freq: Four times a day (QID) | ORAL | Status: DC | PRN
  Administered 2024-02-04 – 2024-02-08 (×4): 650 mg via ORAL
  Filled 2024-02-04 (×5): qty 2

## 2024-02-04 MED ORDER — CYCLOBENZAPRINE HCL 10 MG PO TABS
5.0000 mg | ORAL_TABLET | Freq: Three times a day (TID) | ORAL | Status: DC | PRN
  Administered 2024-02-04 – 2024-02-08 (×4): 5 mg via ORAL
  Filled 2024-02-04 (×4): qty 1

## 2024-02-04 NOTE — ED Notes (Signed)
 ivc/moved to bhu.
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
## 2024-02-04 NOTE — Plan of Care (Signed)
  Problem: Activity: Goal: Interest or engagement in activities will improve Outcome: Progressing   Problem: Education: Goal: Emotional status will improve Outcome: Not Progressing

## 2024-02-04 NOTE — Progress Notes (Signed)
   02/04/24 1300  Charting Type  Charting Type Shift assessment  Neurological  Neuro (WDL) WDL  HEENT  HEENT (WDL) WDL  Respiratory  Respiratory (WDL) WDL  Cardiac  Cardiac (WDL) WDL  Vascular  Vascular (WDL) WDL  Integumentary  Integumentary (WDL) WDL  Braden Scale (Ages 8 and up)  Sensory Perceptions 4  Moisture 4  Activity 4  Mobility 4  Nutrition 4  Friction and Shear 3  Braden Scale Score 23  Musculoskeletal  Musculoskeletal (WDL) WDL  Gastrointestinal  Gastrointestinal (WDL) WDL  GU Assessment  Genitourinary (WDL) WDL  Neurological  Level of Consciousness Alert

## 2024-02-04 NOTE — Progress Notes (Signed)
 Patient is outside of the nurses station asking about when she can see the doctor. This Clinical research associate was trying to explain that the provider is on the other unit and that he will round on this side once he finishes over there. Patient stated "oh, so he's going to start on the Geriatric unit, so he doesn't have to do any work. I can go home and take my own fucking medicine". This Clinical research associate stated that the provider has to speak to her and go over her medications with her in order to start her medication. Patient then states "well, what time does he do his rounds, I did my rounds at 6 am. What do I have to do to see the doctor. If I pass out and code will that help? I have coded before". This Clinical research associate stated that this is not what staff wants in order for her to see the provider and that he would be over in a few. Patient did not like what this writer said, so she turned away, walking back to her room, screamed out and then yelled out again, "I need my fucking medicine, or I'm going home today".

## 2024-02-04 NOTE — Tx Team (Signed)
 Initial Treatment Plan 02/04/2024 5:27 AM Carla King [id number]    PATIENT STRESSORS: Health problems   Substance abuse     PATIENT STRENGTHS: Ability for insight  Communication skills  General fund of knowledge  Motivation for treatment/growth  Religious Affiliation  Supportive family/friends    PATIENT IDENTIFIED PROBLEMS: Depression  SI                   DISCHARGE CRITERIA:  Adequate post-discharge living arrangements Improved stabilization in mood, thinking, and/or behavior  PRELIMINARY DISCHARGE PLAN: Attend 12-step recovery group Outpatient therapy Return to previous living arrangement  PATIENT/FAMILY INVOLVEMENT: This treatment plan has been presented to and reviewed with the patient, Carla King, The patient and family have been given the opportunity to ask questions and make suggestions.  Abran Hoh, RN 02/04/2024, 5:27 AM

## 2024-02-04 NOTE — Group Note (Signed)
 Date:  02/04/2024 Time:  11:07 AM  Group Topic/Focus:  Goals Group:   The focus of this group is to help patients establish daily goals to achieve during treatment and discuss how the patient can incorporate goal setting into their daily lives to aide in recovery.    Participation Level:  Did Not Attend   Mellie Sprinkle [facility] 02/04/2024, 11:07 AM

## 2024-02-04 NOTE — Progress Notes (Signed)
 Pt screamed and cursed at staff throughout the day due to her medications not being ordered when admitted.  Pt threatened to lose it and "go off" if she did not get her medications.  Nursing staff continually assured pt that MD was aware and would assess.  Pt asked staff "do I need to code in order to be given my medication?:" Pt was offered PRN multiple times and declined stating "I need my regular meds and I take them at the same time."    Around 10 30 Pt had gone to the courtyard with the other patients and came back into the unit with a broken piece of equipment and a large screw asking for a washer to fix the equipment.  When asked by this RN for the piece and the screw the pt began yelling at this RN and stating, "you are fucking stupid, do you think I am going to hurt myself with this" and continued walking refusing to relinquish the items.  This RN walked with pt back to the courtyard where she returned the items to the equipment box.    11:45 AM Pt stated that she could not eat without nausea because she had not had her medications.  She then began making loud retching noises in the hallway and went to bed, asking for nausea medication.  This RN called the MD and received an order for a one-time ondansetron.  When the RN approached the pt's room to give the medication the patient was lying on the bathroom floor with her head in the corner next to the shower sleeping, with regular and unlabored respirations and no signs of distress. The curtain to the pt's bathroom was spread out over the pt like a blanket.  She had the shower curtain underneath her. This RN spoke to pt who awoke and began screaming that she tried to open the door and that she fell into the bathroom but denied hitting her head and refused any efforts to assess her, screaming "get out of here fucking bitch" and "I don't want nausea medicine I want my regular medicine."  She then stated she was lying there on the bathroom floor "because  it is cool on my head which has been hurting for 3 days and I need my medicine!"   This RN again attempted to assure pt that the MD had been notified of the need to start her home medications and she continued screaming for this RN to leave the room and refusing any kind of PRN and stating she wanted to sleep on the floor of the bathroom.  Pt stayed on the floor and slept for a while longer then got up and went to the bedroom and slammed her door.  She then rang the call bell and asked for her nurse.  When this RN approached the pt she began screaming and stating she was withdrawing from klonopin  and that this could kill her. She then accused accusing this RN and the charge RN of not trying to help her and then asked to be left alone.    During this entire time this RN had made frequent messages and calls for the attending regarding the patient's behaviors and urgency of need for him to assess the pt and address the patient's medication needs.  The attending assessed the pt upon arrival to the unit and the pt was in a markedly calmer mood after speaking with the MD.  Medications were administered per orders and pt became much less  agitated and stated that the person we saw earlier "was not me."    Staff continues to monitor the pt for safety.     02/04/24 1600  [place] Violence Checklist (Document every shift between 4 & 7)  Confusion 0  Irritable 1  Boisterous 1  Physical threats 1  Verbal threats 1  Attacking objects 1  Total Score 5  Violence Risk Category High Risk  Violence Prevention Guidelines *See Row Information* High Violence Risk interventions implemented  Violence Risk Additional Interventions Keep doors open;Verbal de-escalation;Supportive listening  Patients response to intervention Decreased

## 2024-02-04 NOTE — ED Notes (Signed)
 This RN to speak with patient. Pt is now demanding her service dog, states the services the dog is trained to perform are "comfort", moving in bed, strength training, walking despite patient being visualized with stable ambulation multiple times. This RN conferred with AC. Pt currently in process of being moved to [facility]. This RN explained that due to patient's IVC status, nobody would be taking her out of hospital to retreive her dog, this RN confirmed that the dog is currently cared for with the neighbors, and that patient would not be allowed to have her dog due to nobody being present to care for the dog. Pt notably argumentative, stating that she would give the dog half of her food to feed the dog and that the dog was trained to urinate in a toilet and that if the dog was unable to urinate/go to the bathroom in a toilet, she would put a diaper on the dog to address it's bathroom needs. This RN explained that the dog would be unable to be with the patient in the area she was going to and once again, there was nobody here to physically care for the dog. This RN engaged in in depth conversation with patient. Pt remained argumentative with this RN and staff however eventually voluntarily got into the wheelchair and was escorted to the BHU by EDT and security.

## 2024-02-04 NOTE — Progress Notes (Signed)
 Carla King

## 2024-02-04 NOTE — Progress Notes (Signed)
 58 year old pt came in IVC with hx of substance abuse and bipolar d/o.  Pt A&Ox4 denies pain affect is flat but brightens upon approach.  Thought process linear affect congruent with mood and current mental status.  Appropriate eye contact engagement with appropriate response.  Pt in agreement with treatment plan at this time.  safety checks maintained, no distress noted at this time.

## 2024-02-04 NOTE — Progress Notes (Signed)
   02/04/24 1400  Psych Admission Type (Psych Patients Only)  Admission Status Involuntary  Psychosocial Assessment  Patient Complaints Anxiety;Crying spells;Anger;Agitation  Eye Contact Glaring  Facial Expression Angry;Animated  Affect Angry;Anxious;Labile  Speech Rapid;Pressured;Loud;Tangential  Interaction Assertive;Attention-seeking;Demanding;Dominating;Hostile;Intrusive  Motor Activity Fidgety;Pacing;Restless  Appearance/Hygiene Disheveled  Behavior Characteristics Agressive verbally;Agitated;Anxious;Hyperactive;Impulsive;Irritable;Pacing;Resistant to care;Restless  Mood Labile  Thought Process  Coherency WDL  Content WDL  Delusions None reported or observed  Perception WDL  Hallucination None reported or observed  Judgment Poor  Confusion None  Danger to Self  Current suicidal ideation? Denies  Danger to Others  Danger to Others None reported or observed

## 2024-02-04 NOTE — Group Note (Signed)
 Date:  02/04/2024 Time:  5:09 PM  Group Topic/Focus:  Self Care:   The focus of this group is to help patients understand the importance of self-care in order to improve or restore emotional, physical, spiritual, interpersonal, and financial health.    Participation Level:  Active  Participation Quality:  Appropriate, Drowsy, Intrusive, Monopolizing, Sharing, and Supportive  Affect:  Appropriate, Depressed, and Tearful  Cognitive:  Appropriate, Disorganized, Oriented, and Delusional  Insight: Improving  Engagement in Group:  Engaged and Supportive  Modes of Intervention:  Problem-solving and Support  Additional Comments:  n/a  Rotha Cassels 02/04/2024, 5:09 PM

## 2024-02-04 NOTE — ED Notes (Signed)
 Introduced self to pt. Explained rules of the unit and gave pt a snack at her request. Pt tearful and displaying split personalities.

## 2024-02-05 DIAGNOSIS — F3132 Bipolar disorder, current episode depressed, moderate: Secondary | ICD-10-CM | POA: Diagnosis not present

## 2024-02-05 MED ORDER — BUTALBITAL-APAP-CAFFEINE 50-325-40 MG PO TABS
1.0000 | ORAL_TABLET | Freq: Once | ORAL | Status: AC
  Administered 2024-02-05: 1 via ORAL
  Filled 2024-02-05: qty 1

## 2024-02-05 MED ORDER — BUTALBITAL-APAP-CAFFEINE 50-325-40 MG PO TABS
1.0000 | ORAL_TABLET | Freq: Four times a day (QID) | ORAL | Status: DC | PRN
  Administered 2024-02-05 – 2024-02-08 (×6): 1 via ORAL
  Filled 2024-02-05 (×6): qty 1

## 2024-02-05 NOTE — Progress Notes (Signed)
   02/05/24 0900  Psych Admission Type (Psych Patients Only)  Admission Status Involuntary  Psychosocial Assessment  Patient Complaints Anxiety;Crying spells;Hopelessness;Sadness  Eye Contact Intense  Facial Expression Animated;Sad  Affect Labile  Speech Logical/coherent  Interaction Attention-seeking;Needy  Motor Activity Other (Comment) (WNl)  Appearance/Hygiene Improved  Behavior Characteristics Cooperative  Mood Labile  Thought Process  Coherency WDL  Content WDL  Delusions None reported or observed  Perception WDL  Hallucination None reported or observed  Judgment Poor  Confusion None  Danger to Self  Current suicidal ideation? Denies  Danger to Others  Danger to Others None reported or observed

## 2024-02-05 NOTE — Group Note (Signed)
 Date:  02/05/2024 Time:  5:24 PM  Group Topic/Focus:  Wellness Toolbox:   The focus of this group is to discuss various aspects of wellness, balancing those aspects and exploring ways to increase the ability to experience wellness.  Patients will create a wellness toolbox for use upon discharge.    Participation Level:  Active  Participation Quality:  Appropriate  Affect:  Appropriate  Cognitive:  Appropriate  Insight: Appropriate  Engagement in Group:  Engaged  Modes of Intervention:  Activity and Socialization  Additional Comments:    Laverne Potter 02/05/2024, 5:24 PM

## 2024-02-05 NOTE — Group Note (Signed)
 Date:  02/05/2024 Time:  9:49 AM  Group Topic/Focus:  Goals Group:   The focus of this group is to help patients establish daily goals to achieve during treatment and discuss how the patient can incorporate goal setting into their daily lives to aide in recovery.    Participation Level:  Active  Participation Quality:  Appropriate  Affect:  Appropriate  Cognitive:  Appropriate  Insight: Appropriate  Engagement in Group:  Engaged  Modes of Intervention:  Discussion, Education, and Support  Additional Comments:    Laverne Potter 02/05/2024, 9:49 AM

## 2024-02-05 NOTE — Progress Notes (Signed)
 [facility] MD Progress Note  02/05/2024 2:03 PM Carla King  MRN:  [id number] 58 year old Caucasian female with a past psych history of bipolar 1, PTSD, substance abuse [alcohol, marijuana] and past medical history of colon cancer, back pain, questionable MS, recurrent colon cancer presented to the Hospital ED after she was IVC  by her son with concerns of suicidal thoughts.   Subjective: Per nursing report patient continues to be tearful and crying at times.  Patient reported that she had a loose bowel movement and it was very dark in color.  Of note she does have history of colon cancer with recent recurrence.  Will be involved hospitalist.  Patient also reports significant headache since last year and reports that she follows up with the neurologist and had been prescribed Fioricet at home to help her with her symptoms.  Patient by report did not sleep much.    Patient though denied any suicidal or homicidal ideations or any auditory or visual hallucinations.  At times still noted to be crying and mood lability but better than previous days.  Patient noted to be going to the groups.  Have been calm cooperative.  Today also during the conversation with this writer patient started crying when she was talking about her cancer.  Patient denies any side effects of the medication.  Patient reports that she wants to talk to her outpatient cardiologist and neurologist to work here.  Reports that she had an appointment soon but want to see them as inpatient.  Discussed with the patient that she we will see hospitalist and we will follow their recommendations -if she require to see them while inpatient.  Principal Problem: Bipolar disorder (HCC) Diagnosis: Principal Problem:   Bipolar disorder [facility])   Past Medical History:  Past Medical History:  Diagnosis Date   alcohol    colon ca 2011    Past Surgical History:  Procedure Laterality Date   AUGMENTATION MAMMAPLASTY Bilateral    LAPAROSCOPIC PARTIAL  COLECTOMY Left Feb 2004   Family History: History reviewed. No pertinent family history.  Social History:  Social History   Substance and Sexual Activity  Alcohol Use No     Social History   Substance and Sexual Activity  Drug Use Yes   Types: Marijuana    Social History   Socioeconomic History   Marital status: Married    Spouse name: Not on file   Number of children: Not on file   Years of education: Not on file   Highest education level: Not on file  Occupational History   Not on file  Tobacco Use   Smoking status: Never   Smokeless tobacco: Never  Vaping Use   Vaping status: Every Day  Substance and Sexual Activity   Alcohol use: No   Drug use: Yes    Types: Marijuana   Sexual activity: Yes  Other Topics Concern   Not on file  Social History Narrative   Not on file   Social Drivers of Health   Financial Resource Strain: Low Risk  (09/16/2023)   Received from [facility]   Overall Financial Resource Strain (CARDIA)    Difficulty of Paying Living Expenses: Not hard at all  Food Insecurity: Food Insecurity Present (02/04/2024)   Hunger Vital Sign    Worried About Running Out of Food in the Last Year: Often true    Ran Out of Food in the Last Year: Often true  Transportation Needs: No Transportation Needs (02/04/2024)  PRAPARE - Administrator, Civil Service (Medical): No    Lack of Transportation (Non-Medical): No  Physical Activity: Not on file  Stress: Not on file  Social Connections: Socially Isolated (02/04/2024)   Social Connection and Isolation Panel [NHANES]    Frequency of Communication with Friends and Family: More than three times a week    Frequency of Social Gatherings with Friends and Family: Once a week    Attends Religious Services: Never    Database administrator or Organizations: No    Attends Engineer, structural: Never    Marital Status: Divorced   Additional Social History:      Current  Medications: Current Facility-Administered Medications  Medication Dose Route Frequency Provider Last Rate Last Admin   acetaminophen  (TYLENOL ) tablet 650 mg  650 mg Oral Q6H PRN Furqan Gosselin, MD   650 mg at 02/05/24 3976   clonazePAM  (KLONOPIN ) tablet 0.5 mg  0.5 mg Oral TID PRN Cleophas Yoak, MD   0.5 mg at 02/05/24 0924   cyclobenzaprine (FLEXERIL) tablet 5 mg  5 mg Oral TID PRN Grier Vu, MD   5 mg at 02/05/24 1347   hydrOXYzine (ATARAX) tablet 25 mg  25 mg Oral Q6H PRN Erman Thum, MD       ibuprofen (ADVIL) tablet 200 mg  200 mg Oral Q6H PRN Eleesha Purkey, MD   200 mg at 02/05/24 0824   lithium carbonate (ESKALITH) ER tablet 450 mg  450 mg Oral Q12H Gray Doering, MD   450 mg at 02/05/24 0825   loperamide (IMODIUM) capsule 2-4 mg  2-4 mg Oral PRN Prudence Heiny, MD       LORazepam (ATIVAN) tablet 1 mg  1 mg Oral Q6H PRN Messiah Ahr, MD       magnesium  oxide (MAG-OX) tablet 400 mg  400 mg Oral BID Jadapalle, Sree, MD   400 mg at 02/05/24 7341   multivitamin with minerals tablet 1 tablet  1 tablet Oral Daily Jazmene Racz, MD   1 tablet at 02/05/24 0824   nicotine (NICODERM CQ - dosed in mg/24 hours) patch 14 mg  14 mg Transdermal Daily Dona Walby, MD   14 mg at 02/05/24 0825   OLANZapine (ZYPREXA) injection 10 mg  10 mg Intramuscular TID PRN Luz Mares, MD       OLANZapine (ZYPREXA) injection 5 mg  5 mg Intramuscular TID PRN Astraea Gaughran, MD       OLANZapine zydis (ZYPREXA) disintegrating tablet 5 mg  5 mg Oral TID PRN Antoria Lanza, MD       ondansetron (ZOFRAN-ODT) disintegrating tablet 4 mg  4 mg Oral Once Dasani Crear, MD       ondansetron (ZOFRAN-ODT) disintegrating tablet 4 mg  4 mg Oral Q6H PRN Tanzie Rothschild, MD   4 mg at 02/05/24 0824   paliperidone (INVEGA) 24 hr tablet 6 mg  6 mg Oral Daily Manroop Jakubowicz, MD   6 mg at 02/05/24 0824    simethicone (MYLICON) chewable tablet 80 mg  80 mg Oral BID PRN Bobbitt, Shalon E, NP   80 mg at 02/05/24 0924   thiamine (VITAMIN B1) tablet 100 mg  100 mg Oral Daily Delicia Berens, MD   100 mg at 02/05/24 0825   traZODone  (DESYREL ) tablet 50 mg  50 mg Oral QHS PRN Keriana Sarsfield, MD        Lab Results:  Results for orders placed or performed during the hospital encounter of 02/03/24 (from the  past 48 hours)  Comprehensive metabolic panel     Status: Abnormal   Collection Time: 02/03/24  5:41 PM  Result Value Ref Range   Sodium 135 135 - 145 mmol/L   Potassium 4.0 3.5 - 5.1 mmol/L   Chloride 104 98 - 111 mmol/L   CO2 22 22 - 32 mmol/L   Glucose, Bld 107 (H) 70 - 99 mg/dL    Comment: Glucose reference range applies only to samples taken after fasting for at least 8 hours.   BUN 15 6 - 20 mg/dL   Creatinine, Ser 4.09 0.44 - 1.00 mg/dL   Calcium 9.1 8.9 - 81.1 mg/dL   Total Protein 7.2 6.5 - 8.1 g/dL   Albumin 4.2 3.5 - 5.0 g/dL   AST 27 15 - 41 U/L   ALT 24 0 - 44 U/L   Alkaline Phosphatase 66 38 - 126 U/L   Total Bilirubin 0.6 0.0 - 1.2 mg/dL   GFR, Estimated >91 >47 mL/min    Comment: (NOTE) Calculated using the CKD-EPI Creatinine Equation (2021)    Anion gap 9 5 - 15    Comment: Performed at [facility], [address] [place], [place] [address]  Ethanol     Status: None   Collection Time: 02/03/24  5:41 PM  Result Value Ref Range   Alcohol, Ethyl (B) <15 <15 mg/dL    Comment: (NOTE) For medical purposes only. Performed at [facility], [address] [place], [place] [address]   cbc     Status: None   Collection Time: 02/03/24  5:41 PM  Result Value Ref Range   WBC 6.1 4.0 - 10.5 K/uL   RBC 3.97 3.87 - 5.11 MIL/uL   Hemoglobin 12.6 12.0 - 15.0 g/dL   HCT 65.7 84.6 - 96.2 %   MCV 94.0 80.0 - 100.0 fL   MCH 31.7 26.0 - 34.0 pg   MCHC 33.8 30.0 - 36.0 g/dL   RDW 95.2 84.1 - 32.4 %   Platelets 271 150 - 400 K/uL   nRBC 0.0 0.0 -  0.2 %    Comment: Performed at [facility], [address] [place], [place] [address]  Acetaminophen  level     Status: Abnormal   Collection Time: 02/03/24  7:52 PM  Result Value Ref Range   Acetaminophen  (Tylenol ), Serum <10 (L) 10 - 30 ug/mL    Comment: (NOTE) Therapeutic concentrations vary significantly. A range of 10-30 ug/mL  may be an effective concentration for many patients. However, some  are best treated at concentrations outside of this range. Acetaminophen  concentrations >150 ug/mL at 4 hours after ingestion  and >50 ug/mL at 12 hours after ingestion are often associated with  toxic reactions.  Performed at [facility], [address] [place], [place] [address]   Salicylate level     Status: Abnormal   Collection Time: 02/03/24  7:52 PM  Result Value Ref Range   Salicylate Lvl <7.0 (L) 7.0 - 30.0 mg/dL    Comment: Performed at [facility], [address] [place], [place] [address]  Urine Drug Screen, Qualitative     Status: Abnormal   Collection Time: 02/04/24 12:14 AM  Result Value Ref Range   Tricyclic, Ur Screen POSITIVE (A) NONE DETECTED   Amphetamines, Ur Screen NONE DETECTED NONE DETECTED   MDMA (Ecstasy)Ur Screen NONE DETECTED NONE DETECTED   Cocaine Metabolite,Ur [place] NONE DETECTED NONE DETECTED   Opiate, Ur Screen NONE DETECTED NONE DETECTED  Phencyclidine (PCP) Ur S NONE DETECTED NONE DETECTED   Cannabinoid 50 Ng, Ur [place] POSITIVE (A) NONE DETECTED   Barbiturates, Ur Screen POSITIVE (A) NONE DETECTED   Benzodiazepine, Ur Scrn POSITIVE (A) NONE DETECTED   Methadone Scn, Ur NONE DETECTED NONE DETECTED    Comment: (NOTE) Tricyclics + metabolites, urine    Cutoff 1000 ng/mL Amphetamines + metabolites, urine  Cutoff 1000 ng/mL MDMA (Ecstasy), urine              Cutoff 500 ng/mL Cocaine Metabolite, urine          Cutoff 300 ng/mL Opiate + metabolites, urine        Cutoff 300 ng/mL Phencyclidine (PCP), urine         Cutoff 25  ng/mL Cannabinoid, urine                 Cutoff 50 ng/mL Barbiturates + metabolites, urine  Cutoff 200 ng/mL Benzodiazepine, urine              Cutoff 200 ng/mL Methadone, urine                   Cutoff 300 ng/mL  The urine drug screen provides only a preliminary, unconfirmed analytical test result and should not be used for non-medical purposes. Clinical consideration and professional judgment should be applied to any positive drug screen result due to possible interfering substances. A more specific alternate chemical method must be used in order to obtain a confirmed analytical result. Gas chromatography / mass spectrometry (GC/MS) is the preferred confirm atory method. Performed at [facility], [address] [place], [place] [address]     Blood Alcohol level:  Lab Results  Component Value Date   [facility] <15 02/03/2024    Metabolic Disorder Labs: No results found for: "HGBA1C", "MPG" No results found for: "PROLACTIN" Lab Results  Component Value Date   CHOL 169 02/23/2013   TRIG 60.0 02/23/2013   HDL 68.30 02/23/2013   CHOLHDL 2 02/23/2013   VLDL 12.0 02/23/2013   LDLCALC 89 02/23/2013   LDLCALC 101 (H) 02/18/2012    Physical Findings: AIMS:  , ,  ,  ,    CIWA:  CIWA-Ar Total: 0 COWS:  COWS Total Score: 1  Musculoskeletal: Strength & Muscle Tone: within normal limits Gait & Station: normal Patient leans: N/A       MENTAL STATUS EXAM: Appearance: Female, appearing stated age, in hospital clothing; wearing appropriate to the weather and situation, with  poor grooming and hygiene. Attitude/Behavior:  superficially cooperative, crying and with inconsistent eye contact. Speech was spontaneous , pressured Mood:  anxious , labile , crying Affect:  dysphoric and anxious.  congruent to the mood and appropriate to the situation. Thought content: patient denies suicidal thoughts, denies homicidal thoughts; did not express any delusions. Thought process:   mostly logical and goal-directed. Perception: patient denies auditory and visual hallucinations. Did not appear internally stimulated. Cognition: patient is alert and oriented in self, place, date; with intact abstract,  fund of knowledge -   Normal attention and concentration -  impaired Insight: poor Judgment: poor     Physical exam PHYSICAL EXAMINATION: GENERAL APPEARANCE: Well-developed, well-nourished. HEENT: Normocephalic.  Extraocular movements are within normal limits. Black spot noted on the right earlobe. NECK: Supple, nontender. No masses or enlarged thyroid. LUNGS: Clear to auscultation. HEART: Regular rate. No murmur or gallop. ABDOMEN: Soft, nontender. No masses or organomegaly. BREASTS & RECTAL: Not examined EXTREMITIES: Atraumatic. Negative cyanosis, clubbing  or edema     NEUROLOGIC: Reveals no sensory or motor deficits. CN 2-12 intact   Review of Systems All other systems reviewed and are negative. Blood pressure 112/80, pulse 78, temperature (!) 97.5 F (36.4 C), resp. rate 18, height 5\' 5"  (1.651 m), weight 67.6 kg, last menstrual period 01/28/2015, SpO2 100%. Body mass index is 24.79 kg/m.   Treatment Plan Summary:  Assessment/Plan Diagnosis- Suicidal ideation. Bipolar 1. PTSD. Alcohol use disorder. Cannabis use. Back pain. Colon cancer.     Assessment/Plan   1.    Safety and Monitoring:   --  Involuntary admission to inpatient psychiatric unit for safety, stabilization and treatment -- Daily contact with patient to assess and evaluate symptoms and progress in treatment -- Patient's case to be discussed in multi-disciplinary team meeting -- Observation Level : q15 minute checks -- Vital signs:  q12 hours -- Precautions: suicide   2. Psychiatric Diagnoses and Treatment: Continue lithium 450 mg twice daily, increase Invega to 6 mg today.   Continue patient Klonopin  as needed. Hold off on Adderall. Can consider starting patient on Minipress  if needed. Continue CIWA protocol given history of alcohol use.  Patient denies any history of DTs or withdrawal seizures or any ICU admissions.   --  The risks/benefits/side-effects/alternatives to this medication were discussed in detail with the patient and time was given for questions. The patient consents to medication trial. -- Metabolic profile and EKG monitoring obtained while on an atypical antipsychotic  (BMI: 0.00, QTC: 0.00, HbA1c: 0.00, Lipid panel: 0.00) -- Encouraged patient to participate in unit milieu and in scheduled group therapies -- Short Term Goals: Ability to identify changes in lifestyle to reduce recurrence of condition will improve, Ability to verbalize feelings will improve, Ability to disclose and discuss suicidal ideas, Ability to demonstrate self-control will improve, Ability to identify and develop effective coping behaviors will improve, Ability to maintain clinical measurements within normal limits will improve, Compliance with prescribed medications will improve, and Ability to identify triggers associated with substance abuse/mental health issues will improve -- Long Term Goals: Improvement in symptoms so as ready for discharge        3. Medical Issues Being Addressed: Back pain-continue Flexeril. Headache-continue patient on Fioricet Colon cancer-follow-up with PCP.   Consulted hospitalist : For medical optimization. as patient reports having loose black pleural stools given the history of colon cancer and reported.  Recurrence.  Also patient have chronic headache. Patient requesting to see her cardiologist, neurologist,-discussed with the patient that we have consulted hospitalist and if appropriate will involve other speciality at the recommendation.      4. Discharge Planning:   -- Social work and case management to assist with discharge planning and identification of hospital follow-up needs prior to discharge -- Estimated LOS: 5-7 days -- Discharge  Concerns: Need to establish a safety plan; Medication compliance and effectiveness -- Discharge Goals: Return home with outpatient referrals for mental health follow-up including medication management/psychotherapy  Albert Huff, MD 02/05/2024, 2:03 PM

## 2024-02-05 NOTE — Progress Notes (Signed)
   02/04/24 2000  Psych Admission Type (Psych Patients Only)  Admission Status Involuntary  Psychosocial Assessment  Patient Complaints Anxiety;Crying spells;Sadness  Eye Contact Glaring  Facial Expression Angry;Animated  Affect Anxious;Labile  Speech Rapid;Pressured  Interaction Assertive;Attention-seeking  Motor Activity Fidgety;Pacing  Appearance/Hygiene Improved  Behavior Characteristics Cooperative;Appropriate to situation  Mood Labile  Thought Process  Coherency WDL  Content WDL  Delusions None reported or observed  Perception WDL  Hallucination None reported or observed  Judgment Poor  Confusion None  Danger to Self  Current suicidal ideation? Denies  Danger to Others  Danger to Others None reported or observed   Patient alert and oriented x 4, she appears restless and anxious at times she denies SI/HI/AVH, patient sometimes tearful and sad. Patient was offered emotional support. 15 minutes safety support maintained will continue to monitor.

## 2024-02-05 NOTE — Group Note (Signed)
 Date:  02/05/2024 Time:  8:49 PM  Group Topic/Focus:  Wrap-Up Group:   The focus of this group is to help patients review their daily goal of treatment and discuss progress on daily workbooks.    Participation Level:  Active  Participation Quality:  Appropriate  Affect:  Appropriate  Cognitive:  Appropriate  Insight: Appropriate  Engagement in Group:  Engaged  Modes of Intervention:  Discussion   [name] 02/05/2024, 8:49 PM
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
## 2024-02-05 NOTE — Progress Notes (Signed)
   02/05/24 1000  Stool Measurement/Characteristics  Stool Descriptors  (pt states DARK and loose)   Notified MD

## 2024-02-05 NOTE — Plan of Care (Signed)
  Problem: Education: Goal: Knowledge of [place] [facility] Education information/materials will improve Outcome: Progressing   Problem: Education: Goal: Mental status will improve Outcome: Progressing   Problem: Education: Goal: Emotional status will improve Outcome: Progressing   Problem: Education: Goal: Verbalization of understanding the information provided will improve Outcome: Progressing

## 2024-02-05 NOTE — Plan of Care (Signed)
   Problem: Education: Goal: Emotional status will improve Outcome: Not Progressing Goal: Mental status will improve Outcome: Not Progressing

## 2024-02-06 DIAGNOSIS — F3130 Bipolar disorder, current episode depressed, mild or moderate severity, unspecified: Secondary | ICD-10-CM | POA: Diagnosis not present

## 2024-02-06 MED ORDER — MENTHOL 3 MG MT LOZG
1.0000 | LOZENGE | OROMUCOSAL | Status: DC | PRN
  Filled 2024-02-06: qty 9

## 2024-02-06 NOTE — Plan of Care (Signed)
  Problem: Education: Goal: Knowledge of [place] [facility] Education information/materials will improve Outcome: Progressing Goal: Mental status will improve Outcome: Progressing   Problem: Activity: Goal: Interest or engagement in activities will improve Outcome: Progressing   Problem: Coping: Goal: Ability to verbalize frustrations and anger appropriately will improve Outcome: Progressing   Problem: Physical Regulation: Goal: Ability to maintain clinical measurements within normal limits will improve Outcome: Progressing

## 2024-02-06 NOTE — Progress Notes (Signed)
   02/06/24 1600  Spiritual Encounters  Type of Visit Initial  Care provided to: Patient  Referral source Code page  Reason for visit Urgent spiritual support  OnCall Visit Yes  Spiritual Framework  Presenting Themes Meaning/purpose/sources of inspiration;Values and beliefs;Coping tools;Impactful experiences and emotions;Courage hope and growth  Values/beliefs Pt stated she believes in God  Strengths Highly intelligent and proud of her accomplishments  Needs/Challenges/Barriers Afraid of dying; complicated grief; anger at what's happened to her  Patient Stress Factors Loss;Loss of control;Major life changes;Other (Comment) (Needs to feel "valid" even though she's aware of horrible events in her life and that some of her choices have brought her to some hard places.)  Interventions  Spiritual Care Interventions Made Established relationship of care and support;Compassionate presence;Reflective listening;Normalization of emotions;Narrative/life review;Meaning making;Prayer (Discussed about what "trust" looks like (esp. in God); reframed a dream; talked about fear of death and "grace" that's given when it's "time" to die (If you don't have grace for it yet, maybe it's b/c it's not time yet?); touched on forgiveness)  Intervention Outcomes  Outcomes Connection to spiritual care;Awareness around self/spiritual resourses;Awareness of support;Reduced anxiety;Other (comment) (Pt seemed to be calmer and glad to have "food for thought" in the different things we discussed.)

## 2024-02-06 NOTE — Progress Notes (Signed)
   02/05/24 2000  Psych Admission Type (Psych Patients Only)  Admission Status Involuntary  Psychosocial Assessment  Patient Complaints Anxiety;Crying spells;Panic attack  Eye Contact Glaring  Facial Expression Angry;Animated  Affect Anxious;Labile  Speech Rapid;Pressured  Interaction Assertive;Attention-seeking  Motor Activity Fidgety;Pacing  Appearance/Hygiene Improved  Behavior Characteristics Cooperative;Appropriate to situation  Mood Labile  Thought Process  Coherency WDL  Content WDL  Delusions None reported or observed  Perception WDL  Hallucination None reported or observed  Judgment Poor  Confusion None  Danger to Self  Current suicidal ideation? Denies  Danger to Others  Danger to Others None reported or observed   No distress noted, she denies SI/HI/AVH interacting appropriately with peers an staff. Patient's thoughts are organized and coherent, 15 minutes safety checks maintained.

## 2024-02-06 NOTE — Progress Notes (Signed)
 Pt woke up having a panic attack, PRN medication given, check MAR

## 2024-02-06 NOTE — Group Note (Signed)
 Date:  02/06/2024 Time:  9:02 PM  Group Topic/Focus:  Wrap-Up Group:   The focus of this group is to help patients review their daily goal of treatment and discuss progress on daily workbooks.    Participation Level:  Active  Participation Quality:  Appropriate and Attentive  Affect:  Appropriate  Cognitive:  Delusional  Insight: Lacking and Limited  Engagement in Group:  Engaged, Limited, and Poor  Modes of Intervention:  Discussion  Additional Comments:    Maglione,Burr Soffer E 02/06/2024, 9:02 PM

## 2024-02-06 NOTE — BH IP Treatment Plan (Signed)
 Interdisciplinary Treatment and Diagnostic Plan Update  02/06/2024 Time of Session: 11:33 AM Carla King MRN: [id number]  Principal Diagnosis: Bipolar disorder [facility])  Secondary Diagnoses: Principal Problem:   Bipolar disorder (HCC)   Current Medications:  Current Facility-Administered Medications  Medication Dose Route Frequency Provider Last Rate Last Admin   acetaminophen  (TYLENOL ) tablet 650 mg  650 mg Oral Q6H PRN Shrivastava, Aryendra, MD   650 mg at 02/05/24 1455   butalbital-acetaminophen -caffeine (FIORICET) 50-325-40 MG per tablet 1 tablet  1 tablet Oral Q6H PRN Shrivastava, Aryendra, MD   1 tablet at 02/05/24 2132   clonazePAM  (KLONOPIN ) tablet 0.5 mg  0.5 mg Oral TID PRN Shrivastava, Aryendra, MD   0.5 mg at 02/06/24 0441   cyclobenzaprine (FLEXERIL) tablet 5 mg  5 mg Oral TID PRN Shrivastava, Aryendra, MD   5 mg at 02/05/24 1347   hydrOXYzine (ATARAX) tablet 25 mg  25 mg Oral Q6H PRN Shrivastava, Aryendra, MD       ibuprofen (ADVIL) tablet 200 mg  200 mg Oral Q6H PRN Shrivastava, Aryendra, MD   200 mg at 02/05/24 1455   lithium carbonate (ESKALITH) ER tablet 450 mg  450 mg Oral Q12H Shrivastava, Aryendra, MD   450 mg at 02/06/24 0818   loperamide (IMODIUM) capsule 2-4 mg  2-4 mg Oral PRN Shrivastava, Aryendra, MD       LORazepam (ATIVAN) tablet 1 mg  1 mg Oral Q6H PRN Shrivastava, Aryendra, MD       magnesium  oxide (MAG-OX) tablet 400 mg  400 mg Oral BID Jadapalle, Sree, MD   400 mg at 02/06/24 0818   menthol-cetylpyridinium (CEPACOL) lozenge 3 mg  1 lozenge Oral PRN Madaram, Kondal R, MD       multivitamin with minerals tablet 1 tablet  1 tablet Oral Daily Shrivastava, Aryendra, MD   1 tablet at 02/06/24 0818   nicotine (NICODERM CQ - dosed in mg/24 hours) patch 14 mg  14 mg Transdermal Daily Shrivastava, Aryendra, MD   14 mg at 02/06/24 0818   OLANZapine (ZYPREXA) injection 10 mg  10 mg Intramuscular TID PRN Shrivastava, Aryendra, MD       OLANZapine (ZYPREXA) injection 5  mg  5 mg Intramuscular TID PRN Shrivastava, Aryendra, MD       OLANZapine zydis (ZYPREXA) disintegrating tablet 5 mg  5 mg Oral TID PRN Shrivastava, Aryendra, MD       ondansetron (ZOFRAN-ODT) disintegrating tablet 4 mg  4 mg Oral Once Shrivastava, Aryendra, MD       ondansetron (ZOFRAN-ODT) disintegrating tablet 4 mg  4 mg Oral Q6H PRN Shrivastava, Aryendra, MD   4 mg at 02/05/24 2132   paliperidone (INVEGA) 24 hr tablet 6 mg  6 mg Oral Daily Shrivastava, Aryendra, MD   6 mg at 02/06/24 0818   simethicone (MYLICON) chewable tablet 80 mg  80 mg Oral BID PRN Bobbitt, Shalon E, NP   80 mg at 02/05/24 2132   thiamine (VITAMIN B1) tablet 100 mg  100 mg Oral Daily Shrivastava, Aryendra, MD   100 mg at 02/06/24 0818   traZODone  (DESYREL ) tablet 50 mg  50 mg Oral QHS PRN Shrivastava, Aryendra, MD       PTA Medications: Medications Prior to Admission  Medication Sig Dispense Refill Last Dose/Taking   acyclovir (ZOVIRAX) 800 MG tablet Take 1 tablet by mouth 3 (three) times daily as needed. FOR BREAKOUT      amitriptyline (ELAVIL) 25 MG tablet Take 25-50 mg by mouth 2 (two) times daily.  Take 25mg  in the morning and 25-50 mg at bedtime.      amphetamine-dextroamphetamine (ADDERALL XR) 15 MG 24 hr capsule Take by mouth every morning.      butalbital-acetaminophen -caffeine (FIORICET) 50-325-40 MG tablet Take 1-2 tablets by mouth every 8 (eight) hours as needed for headache.      clonazePAM  (KLONOPIN ) 0.5 MG tablet Take 1 tablet (0.5 mg total) by mouth daily as needed for anxiety. 30 tablet 1    cyclobenzaprine (FLEXERIL) 5 MG tablet Take 5 mg by mouth 3 (three) times daily as needed.      ibuprofen (ADVIL) 200 MG tablet Take 200 mg by mouth every 6 (six) hours as needed.      [EXPIRED] magnesium  oxide (MAG-OX) 400 MG tablet Take 400 mg by mouth as directed.      PARoxetine (PAXIL) 20 MG tablet Take 1 tablet by mouth 2 (two) times daily. (Patient not taking: Reported on 02/03/2024)      prochlorperazine   (COMPAZINE ) 5 MG tablet Take 5 mg by mouth every 6 (six) hours as needed.      traZODone  (DESYREL ) 100 MG tablet Take 1 tablet (100 mg total) by mouth at bedtime. (Patient not taking: Reported on 02/03/2024) 30 tablet 1    zolpidem  (AMBIEN ) 10 MG tablet Take 1 tablet (10 mg total) by mouth at bedtime as needed for sleep. 30 tablet 1     Patient Stressors: Health problems   Substance abuse    Patient Strengths: Ability for insight  Forensic psychologist fund of knowledge  Motivation for treatment/growth  Religious Affiliation  Supportive family/friends   Treatment Modalities: Medication Management, Group therapy, Case management,  1 to 1 session with clinician, Psychoeducation, Recreational therapy.   Physician Treatment Plan for Primary Diagnosis: Bipolar disorder (HCC) Long Term Goal(s):     Short Term Goals:    Medication Management: Evaluate patient's response, side effects, and tolerance of medication regimen.  Therapeutic Interventions: 1 to 1 sessions, Unit Group sessions and Medication administration.  Evaluation of Outcomes: Not Met  Physician Treatment Plan for Secondary Diagnosis: Principal Problem:   Bipolar disorder (HCC)  Long Term Goal(s):     Short Term Goals:       Medication Management: Evaluate patient's response, side effects, and tolerance of medication regimen.  Therapeutic Interventions: 1 to 1 sessions, Unit Group sessions and Medication administration.  Evaluation of Outcomes: Not Met   RN Treatment Plan for Primary Diagnosis: Bipolar disorder (HCC) Long Term Goal(s): Knowledge of disease and therapeutic regimen to maintain health will improve  Short Term Goals: Ability to verbalize frustration and anger appropriately will improve, Ability to demonstrate self-control, Ability to participate in decision making will improve, Ability to verbalize feelings will improve, Ability to disclose and discuss suicidal ideas, Ability to identify and  develop effective coping behaviors will improve, and Compliance with prescribed medications will improve  Medication Management: RN will administer medications as ordered by provider, will assess and evaluate patient's response and provide education to patient for prescribed medication. RN will report any adverse and/or side effects to prescribing provider.  Therapeutic Interventions: 1 on 1 counseling sessions, Psychoeducation, Medication administration, Evaluate responses to treatment, Monitor vital signs and CBGs as ordered, Perform/monitor CIWA, COWS, AIMS and Fall Risk screenings as ordered, Perform wound care treatments as ordered.  Evaluation of Outcomes: Not Met   LCSW Treatment Plan for Primary Diagnosis: Bipolar disorder [facility]) Long Term Goal(s): Safe transition to appropriate next level of care at discharge, Engage patient in  therapeutic group addressing interpersonal concerns.  Short Term Goals: Engage patient in aftercare planning with referrals and resources, Increase social support, Increase ability to appropriately verbalize feelings, Increase emotional regulation, Facilitate acceptance of mental health diagnosis and concerns, Facilitate patient progression through stages of change regarding substance use diagnoses and concerns, Identify triggers associated with mental health/substance abuse issues, and Increase skills for wellness and recovery  Therapeutic Interventions: Assess for all discharge needs, 1 to 1 time with Social worker, Explore available resources and support systems, Assess for adequacy in community support network, Educate family and significant other(s) on suicide prevention, Complete Psychosocial Assessment, Interpersonal group therapy.  Evaluation of Outcomes: Not Met   Progress in Treatment: Attending groups: Yes. Participating in groups: Yes. Taking medication as prescribed: Yes. Toleration medication: Yes. Family/Significant other contact made: No, will  contact:  CSW to contact once permission is granted.  Patient understands diagnosis: Yes. Discussing patient identified problems/goals with staff: Yes. Medical problems stabilized or resolved: Yes. Denies suicidal/homicidal ideation: Yes. Issues/concerns per patient self-inventory: No. Other: None  New problem(s) identified: No, Describe:  None  New Short Term/Long Term Goal(s): detox, elimination of symptoms of psychosis, medication management for mood stabilization; elimination of SI thoughts; development of comprehensive mental wellness/sobriety plan.    Patient Goals:  "Getting me on the right medications."  Discharge Plan or Barriers: CSW to assist in the development of appropriate discharge plan.   Reason for Continuation of Hospitalization: Aggression Anxiety Depression Suicidal ideation Withdrawal symptoms  Estimated Length of Stay: 1-7 days.   Last 3 [place] Suicide Severity Risk Score: Flowsheet Row Admission (Current) from 02/04/2024 in [facility] ED from 02/03/2024 in [facility] Emergency Department at [facility] UC from 01/05/2024 in [facility] at [place]   C-SSRS RISK CATEGORY No Risk No Risk No Risk       Last PHQ 2/9 Scores:     No data to display          Scribe for Treatment Team: Aspynn Clover M Aneli Zara, LCSW 02/06/2024 1:47 PM

## 2024-02-06 NOTE — Plan of Care (Signed)
  Problem: Education: Goal: Knowledge of [place] [facility] Education information/materials will improve Outcome: Progressing   Problem: Education: Goal: Emotional status will improve Outcome: Progressing   Problem: Education: Goal: Verbalization of understanding the information provided will improve Outcome: Progressing   

## 2024-02-06 NOTE — Group Note (Signed)
 Recreation Therapy Group Note   Group Topic:Emotion Expression  Group Date: 02/06/2024 Start Time: 1015 End Time: 1115 Facilitators: Yvonna Herder, CTRS Location: Craft Room  Group Description: Positivity Collage. LRT and patients discussed the importance of having a positive mindset and being happy. Patients received magazines, safety scissors, a glue stick and a piece of paper. Pts were encouraged to find images or words in the magazines that showed "happiness" or positivity to them. Pt shared their collage with the group once they were finished. LRT and pts discussed how it can be difficult to always have a positive mindset, especially when they have mental health challenges.   Goal Area(s) Addressed:  Pt will identify things associate with positivity. Pt will reduce negative thinking. Pt will identify a new coping skill of thinking positive thoughts.    Affect/Mood: Appropriate   Participation Level: Active and Engaged   Participation Quality: Independent   Behavior: Appropriate, Calm, and Cooperative   Speech/Thought Process: Coherent   Insight: Good   Judgement: Good   Modes of Intervention: Art   Patient Response to Interventions:  Attentive, Engaged, Interested , and Receptive   Education Outcome:  Acknowledges education   Clinical Observations/Individualized Feedback: Emmalena was active in their participation of session activities and group discussion. Pt identified "I like plants, sports, vintage furniture and to eat good food". Pt interacted well with LRT and peers duration of session.    Plan: Continue to engage patient in RT group sessions 2-3x/week.   Deatrice Factor, LRT, CTRS 02/06/2024 12:14 PM

## 2024-02-06 NOTE — Progress Notes (Signed)
   02/06/24 1100  Psych Admission Type (Psych Patients Only)  Admission Status Involuntary  Psychosocial Assessment  Patient Complaints Crying spells  Eye Contact Fair  Facial Expression Animated  Affect Labile  Speech Logical/coherent  Interaction Assertive  Motor Activity Fidgety  Appearance/Hygiene Unremarkable  Behavior Characteristics Cooperative;Appropriate to situation  Mood Labile;Pleasant  Thought Process  Coherency WDL  Content WDL  Delusions None reported or observed  Perception WDL  Hallucination None reported or observed  Judgment Impaired  Confusion WDL  Danger to Self  Current suicidal ideation? Denies  Danger to Others  Danger to Others None reported or observed   Patient sad and tearful. No PRN medications needed for anxiety. PRN given for pain and nausea with good result. Patient waiting for medical consult. Support and encouragement given.

## 2024-02-06 NOTE — Group Note (Signed)
 LCSW Group Therapy Note  Group Date: 02/06/2024 Start Time: 1300 End Time: 1350   Type of Therapy and Topic:  Group Therapy - How To Cope with Nervousness about Discharge   Participation Level:  Did Not Attend   Description of Group This process group involved identification of patients' feelings about discharge. Some of them are scheduled to be discharged soon, while others are new admissions, but each of them was asked to share thoughts and feelings surrounding discharge from the hospital. One common theme was that they are excited at the prospect of going home, while another was that many of them are apprehensive about sharing why they were hospitalized. Patients were given the opportunity to discuss these feelings with their peers in preparation for discharge.  Therapeutic Goals  Patient will identify their overall feelings about pending discharge. Patient will think about how they might proactively address issues that they believe will once again arise once they get home (i.e. with parents). Patients will participate in discussion about having hope for change.   Summary of Patient Progress:   X   Therapeutic Modalities Cognitive Behavioral Therapy   Larri Ply, LCSW 02/06/2024  1:47 PM

## 2024-02-06 NOTE — Group Note (Signed)
 Date:  02/06/2024 Time:  9:32 AM  Group Topic/Focus:  Goals Group:   The focus of this group is to help patients establish daily goals to achieve during treatment and discuss how the patient can incorporate goal setting into their daily lives to aide in recovery.    Participation Level:  Active  Participation Quality:  Appropriate  Affect:  Appropriate  Cognitive:  Appropriate  Insight: Appropriate  Engagement in Group:  Engaged  Modes of Intervention:  Discussion, Education, and Support  Additional Comments:    Laverne Potter 02/06/2024, 9:32 AM

## 2024-02-07 ENCOUNTER — Encounter: Payer: Self-pay | Admitting: Psychiatry

## 2024-02-07 ENCOUNTER — Inpatient Hospital Stay

## 2024-02-07 DIAGNOSIS — F3132 Bipolar disorder, current episode depressed, moderate: Secondary | ICD-10-CM

## 2024-02-07 LAB — COMPREHENSIVE METABOLIC PANEL WITH GFR
ALT: 21 U/L (ref 0–44)
AST: 20 U/L (ref 15–41)
Albumin: 3.9 g/dL (ref 3.5–5.0)
Alkaline Phosphatase: 59 U/L (ref 38–126)
Anion gap: 5 (ref 5–15)
BUN: 22 mg/dL — ABNORMAL HIGH (ref 6–20)
CO2: 28 mmol/L (ref 22–32)
Calcium: 9.1 mg/dL (ref 8.9–10.3)
Chloride: 104 mmol/L (ref 98–111)
Creatinine, Ser: 0.57 mg/dL (ref 0.44–1.00)
GFR, Estimated: >60 mL/min (ref >60)
Glucose, Bld: 96 mg/dL (ref 70–99)
Potassium: 4.3 mmol/L (ref 3.5–5.1)
Sodium: 137 mmol/L (ref 135–145)
Total Bilirubin: 0.4 mg/dL (ref 0.0–1.2)
Total Protein: 6.9 g/dL (ref 6.5–8.1)

## 2024-02-07 LAB — CBC WITH DIFFERENTIAL/PLATELET
Abs Immature Granulocytes: 0.03 10*3/uL (ref 0.00–0.07)
Basophils Absolute: 0 10*3/uL (ref 0.0–0.1)
Basophils Relative: 1 %
Eosinophils Absolute: 0.2 10*3/uL (ref 0.0–0.5)
Eosinophils Relative: 3 %
HCT: 38.1 % (ref 36.0–46.0)
Hemoglobin: 12.8 g/dL (ref 12.0–15.0)
Immature Granulocytes: 1 %
Lymphocytes Relative: 20 %
Lymphs Abs: 1.3 10*3/uL (ref 0.7–4.0)
MCH: 31.8 pg (ref 26.0–34.0)
MCHC: 33.6 g/dL (ref 30.0–36.0)
MCV: 94.8 fL (ref 80.0–100.0)
Monocytes Absolute: 0.5 10*3/uL (ref 0.1–1.0)
Monocytes Relative: 8 %
Neutro Abs: 4.2 10*3/uL (ref 1.7–7.7)
Neutrophils Relative %: 67 %
Platelets: 247 10*3/uL (ref 150–400)
RBC: 4.02 MIL/uL (ref 3.87–5.11)
RDW: 12.8 % (ref 11.5–15.5)
WBC: 6.1 10*3/uL (ref 4.0–10.5)
nRBC: 0 % (ref 0.0–0.2)

## 2024-02-07 MED ORDER — IOHEXOL 300 MG/ML  SOLN
100.0000 mL | Freq: Once | INTRAMUSCULAR | Status: AC | PRN
  Administered 2024-02-07: 100 mL via INTRAVENOUS

## 2024-02-07 MED ORDER — BISACODYL 10 MG RE SUPP
10.0000 mg | Freq: Once | RECTAL | Status: AC
  Administered 2024-02-07: 10 mg via RECTAL
  Filled 2024-02-07: qty 1

## 2024-02-07 MED ORDER — DICYCLOMINE HCL 20 MG PO TABS
20.0000 mg | ORAL_TABLET | Freq: Three times a day (TID) | ORAL | Status: DC
  Administered 2024-02-07 – 2024-02-08 (×5): 20 mg via ORAL
  Filled 2024-02-07 (×6): qty 1

## 2024-02-07 MED ORDER — POLYETHYLENE GLYCOL 3350 17 G PO PACK
17.0000 g | PACK | Freq: Every day | ORAL | Status: DC
  Administered 2024-02-07: 17 g via ORAL
  Filled 2024-02-07 (×2): qty 1

## 2024-02-07 MED ORDER — ONDANSETRON HCL 4 MG/2ML IJ SOLN
4.0000 mg | Freq: Four times a day (QID) | INTRAMUSCULAR | Status: DC
  Filled 2024-02-07 (×2): qty 2

## 2024-02-07 MED ORDER — TRAMADOL HCL 50 MG PO TABS
50.0000 mg | ORAL_TABLET | Freq: Two times a day (BID) | ORAL | Status: DC | PRN
  Administered 2024-02-07 (×2): 50 mg via ORAL
  Filled 2024-02-07 (×2): qty 1

## 2024-02-07 MED ORDER — IOHEXOL 9 MG/ML PO SOLN: 500.0000 mL | ORAL | Status: AC

## 2024-02-07 MED ORDER — ONDANSETRON 4 MG PO TBDP
4.0000 mg | ORAL_TABLET | Freq: Four times a day (QID) | ORAL | Status: DC
  Administered 2024-02-07 – 2024-02-08 (×5): 4 mg via ORAL
  Filled 2024-02-07 (×5): qty 1

## 2024-02-07 NOTE — Progress Notes (Signed)
"  I feel good; I have been talking about my mother, she is ill.  My cancer is coming back. It is difficult.  Pt denied SI/HI, AVH and anxiety.  Endorsed depression rate moderate.  Visible in milieu, social and engaging.     02/07/24 0000  Psych Admission Type (Psych Patients Only)  Admission Status Involuntary  Psychosocial Assessment  Patient Complaints Depression  Eye Contact Fair  Facial Expression Animated  Affect Labile  Speech Logical/coherent  Interaction Assertive  Motor Activity Hyperactive  Appearance/Hygiene Unremarkable  Behavior Characteristics Cooperative;Appropriate to situation  Mood Labile  Thought Process  Coherency WDL  Content WDL  Delusions None reported or observed  Perception WDL  Hallucination None reported or observed  Judgment Impaired  Confusion WDL  Danger to Self  Current suicidal ideation? Denies  Agreement Not to Harm Self Yes  Description of Agreement Verbal  Danger to Others  Danger to Others None reported or observed

## 2024-02-07 NOTE — Group Note (Signed)
 [facility] LCSW Group Therapy Note   Group Date: 02/07/2024 Start Time: 1300 End Time: 1350  Type of Therapy/Topic:  Group Therapy:  Feelings about Diagnosis  Participation Level:  Did Not Attend    Description of Group:    This group will allow patients to explore their thoughts and feelings about diagnoses they have received. Patients will be guided to explore their level of understanding and acceptance of these diagnoses. Facilitator will encourage patients to process their thoughts and feelings about the reactions of others to their diagnosis, and will guide patients in identifying ways to discuss their diagnosis with significant others in their lives. This group will be process-oriented, with patients participating in exploration of their own experiences as well as giving and receiving support and challenge from other group members.   Therapeutic Goals: 1. Patient will demonstrate understanding of diagnosis as evidence by identifying two or more symptoms of the disorder:  2. Patient will be able to express two feelings regarding the diagnosis 3. Patient will demonstrate ability to communicate their needs through discussion and/or role plays  Summary of Patient Progress: Patient did not attend group.    Therapeutic Modalities:   Cognitive Behavioral Therapy Brief Therapy Feelings Identification    Randolm Butte, LCSW

## 2024-02-07 NOTE — H&P (Signed)
 History and Physical    Patient: Carla King:[id number] DOB: Sep 26, 1965 DOA: 02/04/2024 DOS: the patient was seen and examined on 02/07/2024 PCP: Yehuda Helms, MD  Patient coming from: Admitted to the behavioral health  Chief Complaint: Abdominal pain consulted by psychiatrist HPI: Carla King is a 58 y.o. female with medical history significant of colon cancer, back pain, questionable diagnosis of MS is currently admitted at the behavioral health after she was IVC by her son for concern of suicidal thoughts.  Patient also has PTSD substance abuse disorder and bipolar.  The patient reports having had abdominal cramps going on for about 3 to 4 days associated with some nausea/dry heaving and 1 episode of vomiting this morning.  Patient reports she had about black watery stool over 3 days ago but subsequently she is constipated and this morning she passed only very small volume of hard stool.  Patient denies having any fevers. Review of Systems: As mentioned in the history of present illness. All other systems reviewed and are negative. Past Medical History:  Diagnosis Date   alcohol    colon ca 2011   Past Surgical History:  Procedure Laterality Date   AUGMENTATION MAMMAPLASTY Bilateral    LAPAROSCOPIC PARTIAL COLECTOMY Left Feb 2004   Social History:  reports that she has never smoked. She has never used smokeless tobacco. She reports current drug use. Drug: Marijuana. She reports that she does not drink alcohol.  Allergies  Allergen Reactions   Droperidol    Magnesium -Containing Compounds Other (See Comments)    **IV ONLY** TABLETS OKAY** "Hallucinations, pain at injection site, makes me go crazy"    History reviewed. No pertinent family history.  Prior to Admission medications   Medication Sig Start Date End Date Taking? Authorizing Provider  acyclovir (ZOVIRAX) 800 MG tablet Take 1 tablet by mouth 3 (three) times daily as needed. FOR BREAKOUT 12/05/23   [provider]  amitriptyline (ELAVIL) 25 MG tablet Take 25-50 mg by mouth 2 (two) times daily. Take 25mg  in the morning and 25-50 mg at bedtime.    [provider]  amphetamine-dextroamphetamine (ADDERALL XR) 15 MG 24 hr capsule Take by mouth every morning.    [provider]  butalbital-acetaminophen -caffeine (FIORICET) 50-325-40 MG tablet Take 1-2 tablets by mouth every 8 (eight) hours as needed for headache.    [provider]  clonazePAM  (KLONOPIN ) 0.5 MG tablet Take 1 tablet (0.5 mg total) by mouth daily as needed for anxiety. 04/02/13   Tullo, Teresa L, MD  cyclobenzaprine (FLEXERIL) 5 MG tablet Take 5 mg by mouth 3 (three) times daily as needed.    [provider]  ibuprofen (ADVIL) 200 MG tablet Take 200 mg by mouth every 6 (six) hours as needed.    [provider]  PARoxetine (PAXIL) 20 MG tablet Take 1 tablet by mouth 2 (two) times daily. Patient not taking: Reported on 02/03/2024 10/20/23   [provider]  prochlorperazine  (COMPAZINE ) 5 MG tablet Take 5 mg by mouth every 6 (six) hours as needed.    [provider]  traZODone  (DESYREL ) 100 MG tablet Take 1 tablet (100 mg total) by mouth at bedtime. Patient not taking: Reported on 02/03/2024 03/09/13   Thersia Flax, MD  zolpidem  (AMBIEN ) 10 MG tablet Take 1 tablet (10 mg total) by mouth at bedtime as needed for sleep. 02/18/12 03/19/12  Thersia Flax, MD    Physical Exam: Vitals:   02/05/24 1626 02/06/24 7829 02/06/24 1627  02/07/24 0622  BP: 99/68 112/82 98/77 117/82  Pulse: 98 79 89 86  Resp:  16  (!) 21  Temp:  (!) 97 F (36.1 C)  97.7 F (36.5 C)  TempSrc:      SpO2: 98% 100% 100% 96%  Weight:      Height:       General:Awake, alert oriented x 1.  Mild distress secondary to abdominal pain CV:                  S1-S2 positive, no murmurs gallops or rub Resp:               Normal effort.  Bilateral equal air entry, no wheezing or rhonchi Abd:                 Abdomen  is soft, no distention, mild diffuse tenderness, bowel sounds positive no rigidity or rebound tenderness Genitourinary: Deferred Musculoskeletal: Movement of all extremities passive or active causes pain Neuro: Alert oriented x 1, cranial nerves II to XII intact, strength in all 4 extremities 5 out of 5.   Data Reviewed:  Reviewed CBC, CMP all within normal limits.  Assessment and Plan: #1 abdominal pain: - Complains of cramping with some nausea and 1 episode of vomiting this morning, complains of constipation - CT scan of the abdomen and pelvis with contrast was done which was completely negative for any acute abnormalities - Suspect this could be secondary to underlying constipation, will initiate the patient on a laxatives to facilitate bowel movement - If the patient still continues to have pain can consider antispasmodic such as Bentyl and as needed Zofran for nausea.  2.  PTSD/bipolar disorder/suicidal thoughts followed by the primary psychiatry team.    Advance Care Planning:   Code Status: Full Code   Consults:   Family Communication:   Severity of Illness: The appropriate patient status for this patient is INPATIENT. Inpatient status is judged to be reasonable and necessary in order to provide the required intensity of service to ensure the patient's safety. The patient's presenting symptoms, physical exam findings, and initial radiographic and laboratory data in the context of their chronic comorbidities is felt to place them at high risk for further clinical deterioration. Furthermore, it is not anticipated that the patient will be medically stable for discharge from the hospital within 2 midnights of admission.   * I certify that at the point of admission it is my clinical judgment that the patient will require inpatient hospital care spanning beyond 2 midnights from the point of admission due to high intensity of service, high risk for further deterioration and high frequency  of surveillance required.*  Author: Edwena Graham, MD 02/07/2024 5:01 PM  For on call review [email or web address].

## 2024-02-07 NOTE — Progress Notes (Signed)
   02/07/24 1200  Psych Admission Type (Psych Patients Only)  Admission Status Involuntary  Psychosocial Assessment  Patient Complaints Crying spells;Depression;Worrying  Eye Contact Fair  Facial Expression Sad  Affect Labile  Speech Logical/coherent  Interaction Assertive  Motor Activity Slow  Appearance/Hygiene Unremarkable  Behavior Characteristics Intrusive;Irritable  Mood Labile  Thought Process  Coherency WDL  Content WDL  Delusions None reported or observed  Perception WDL  Hallucination None reported or observed  Judgment Impaired  Confusion WDL  Danger to Self  Current suicidal ideation? Denies  Danger to Others  Danger to Others None reported or observed   Patient had PRN medications for nausea and pain with good result. Patient easily get irritable with somatic symptoms. Support and encouragement given.

## 2024-02-07 NOTE — Plan of Care (Signed)
   Problem: Education: Goal: Emotional status will improve Outcome: Progressing Goal: Verbalization of understanding the information provided will improve Outcome: Progressing

## 2024-02-07 NOTE — Group Note (Signed)
 Date:  02/07/2024 Time:  7:02 PM  Group Topic/Focus:   Structured Activity   Participation Level:  Active  Participation Quality:  Appropriate  Affect:  Appropriate  Cognitive:  Appropriate  Insight: Appropriate  Engagement in Group:  Engaged  Modes of Intervention:  Activity  Additional Comments:    Renly Guedes A Bless Belshe 02/07/2024, 7:02 PM

## 2024-02-07 NOTE — Progress Notes (Signed)
   02/07/24 0900  Spiritual Encounters  Type of Visit Initial  Care provided to: Patient  Conversation partners present during encounter Nurse  Reason for visit Routine spiritual support  OnCall Visit No   Chaplain visited Unit and while rounding, found patient leading a Yoga Group for other patients.  Chaplain marveled at patient's level of skill and participated in the group session for a brief time, as well.  Chaplain celebrated patient taking the initiative to facilitate such a positive opportunity for promoting self care and emotional regulation.    Rev. Rana M. Nolon Baxter, M.Div. Chaplain Resident [facility]

## 2024-02-07 NOTE — Progress Notes (Signed)
   02/07/24 2200  Psych Admission Type (Psych Patients Only)  Admission Status Involuntary  Psychosocial Assessment  Patient Complaints Crying spells;Depression  Eye Contact Fair  Facial Expression Sad  Affect Labile  Speech Logical/coherent  Interaction Assertive  Motor Activity Slow  Appearance/Hygiene Unremarkable  Behavior Characteristics Intrusive  Mood Labile  Thought Process  Coherency WDL  Content WDL  Delusions None reported or observed  Perception WDL  Hallucination None reported or observed  Confusion WDL  Danger to Self  Current suicidal ideation? Denies  Agreement Not to Harm Self Yes  Description of Agreement Verbal  Danger to Others  Danger to Others None reported or observed

## 2024-02-07 NOTE — Group Note (Signed)
 Date:  02/07/2024 Time:  10:03 AM  Group Topic/Focus:  Wellness Toolbox:   The focus of this group is to discuss various aspects of wellness, balancing those aspects and exploring ways to increase the ability to experience wellness.  Patients will create a wellness toolbox for use upon discharge.    Participation Level:  Active  Participation Quality:  Appropriate  Affect:  Appropriate  Cognitive:  Appropriate  Insight: Appropriate  Engagement in Group:  Engaged  Modes of Intervention:  Activity  Additional Comments:    Marianna Shirk Judianne Seiple 02/07/2024, 10:03 AM

## 2024-02-07 NOTE — Group Note (Signed)
 Recreation Therapy Group Note   Group Topic:Coping Skills  Group Date: 02/07/2024 Start Time: 1000 End Time: 1050 Facilitators: Deatrice Factor, LRT, CTRS Location: Craft Room  Group Description: Mind Map.  Patient was provided a blank template of a diagram with 32 blank boxes in a tiered system, branching from the center (similar to a bubble chart). LRT directed patients to label the middle of the diagram "Coping Skills". LRT and patients then came up with 8 different coping skills as examples. Pt were directed to record their coping skills in the 2nd tier boxes closest to the center.  Patients would then share their coping skills with the group as LRT wrote them out. LRT gave a handout of 99 different coping skills at the end of group.   Goal Area(s) Addressed: Patients will be able to define "coping skills". Patient will identify new coping skills.  Patient will increase communication.   Affect/Mood: Appropriate   Participation Level: Minimal    Clinical Observations/Individualized Feedback: Carla King was originally present in group, however ran out of the craft room due to said stomach pain.   Plan: Continue to engage patient in RT group sessions 2-3x/week.   Deatrice Factor, LRT, CTRS 02/07/2024 11:37 AM

## 2024-02-07 NOTE — [facility] Suicide Risk Assessment (Signed)
 [facility] INPATIENT:  Family/Significant Other Suicide Prevention Education  Suicide Prevention Education:  Education Completed; Carla King/son [phone]), has been identified by the patient as the family member/significant other with whom the patient will be residing, and identified as the person(s) who will aid the patient in the event of a mental health crisis (suicidal ideations/suicide attempt).  With written consent from the patient, the family member/significant other has been provided the following suicide prevention education, prior to the and/or following the discharge of the patient.  The suicide prevention education provided includes the following: Suicide risk factors Suicide prevention and interventions National Suicide Hotline telephone number [facility] assessment telephone number [facility] Emergency Assistance 911 [facility] and/or Residential Mobile Crisis Unit telephone number  Request made of family/significant other to: Remove weapons (e.g., guns, rifles, knives), all items previously/currently identified as safety concern.   Remove drugs/medications (over-the-counter, prescriptions, illicit drugs), all items previously/currently identified as a safety concern.  The family member/significant other verbalizes understanding of the suicide prevention education information provided.  The family member/significant other agrees to remove the items of safety concern listed above.   Carla King shared that he had his mother committed due to manic behavior, SI, and sending messages to him wanting to kill people. He shared that she was thinking that people were after her and was carrying around a small saw tool. He shared that he is not certain about whether she is a danger to herself or anyone else at this point but that he knew she was when he filed the paperwork. He denied mother having access to any guns and stated that the only thing he knew she had was the little  saw he had seen her with. He inquired about how pt was doing and was given an update. No other concerns expressed. Contact ended without incident.   Carla King 02/07/2024, 2:41 PM

## 2024-02-07 NOTE — Progress Notes (Signed)
 [facility] MD Progress Note  02/06/2024 8:52 AM Carla King  MRN:  [id number] 57 year old Caucasian female with a past psych history of bipolar 1, PTSD, substance abuse [alcohol, marijuana] and past medical history of colon cancer, back pain, questionable MS, recurrent colon cancer presented to the Hospital ED after she was IVC  by her son with concerns of suicidal thoughts.   Subjective:   58 year old female who was seen individually and also in treatment team today she reports that she felt overwhelmed with recent possibility of melanoma /cancer coming back she reports financial issues she reports health issues she feels overwhelmed she is quite circumstantial at times extremely depressed she was started on lithium and other home medication she gets sometimes irritable and dysphoric but denies any current suicidal and homicidal thoughts..  Principal Problem: Bipolar disorder (HCC) Diagnosis: Principal Problem:   Bipolar disorder [facility])   Past Medical History:  Past Medical History:  Diagnosis Date   alcohol    colon ca 2011    Past Surgical History:  Procedure Laterality Date   AUGMENTATION MAMMAPLASTY Bilateral    LAPAROSCOPIC PARTIAL COLECTOMY Left Feb 2004   Family History: History reviewed. No pertinent family history.  Social History:  Social History   Substance and Sexual Activity  Alcohol Use No     Social History   Substance and Sexual Activity  Drug Use Yes   Types: Marijuana    Social History   Socioeconomic History   Marital status: Married    Spouse name: Not on file   Number of children: Not on file   Years of education: Not on file   Highest education level: Not on file  Occupational History   Not on file  Tobacco Use   Smoking status: Never   Smokeless tobacco: Never  Vaping Use   Vaping status: Every Day  Substance and Sexual Activity   Alcohol use: No   Drug use: Yes    Types: Marijuana   Sexual activity: Yes  Other Topics Concern   Not on file   Social History Narrative   Not on file   Social Drivers of Health   Financial Resource Strain: Low Risk  (09/16/2023)   Received from [facility]   Overall Financial Resource Strain (CARDIA)    Difficulty of Paying Living Expenses: Not hard at all  Food Insecurity: Food Insecurity Present (02/04/2024)   Hunger Vital Sign    Worried About Running Out of Food in the Last Year: Often true    Ran Out of Food in the Last Year: Often true  Transportation Needs: No Transportation Needs (02/04/2024)   PRAPARE - Administrator, Civil Service (Medical): No    Lack of Transportation (Non-Medical): No  Physical Activity: Not on file  Stress: Not on file  Social Connections: Socially Isolated (02/04/2024)   Social Connection and Isolation Panel [NHANES]    Frequency of Communication with Friends and Family: More than three times a week    Frequency of Social Gatherings with Friends and Family: Once a week    Attends Religious Services: Never    Database administrator or Organizations: No    Attends Engineer, structural: Never    Marital Status: Divorced   Additional Social History:      Current Medications: Current Facility-Administered Medications  Medication Dose Route Frequency Provider Last Rate Last Admin   acetaminophen  (TYLENOL ) tablet 650 mg  650 mg Oral Q6H PRN Shrivastava, Aryendra, MD  650 mg at 02/05/24 1455   butalbital-acetaminophen -caffeine (FIORICET) 50-325-40 MG per tablet 1 tablet  1 tablet Oral Q6H PRN Shrivastava, Aryendra, MD   1 tablet at 02/07/24 0630   clonazePAM  (KLONOPIN ) tablet 0.5 mg  0.5 mg Oral TID PRN Shrivastava, Aryendra, MD   0.5 mg at 02/06/24 0441   cyclobenzaprine (FLEXERIL) tablet 5 mg  5 mg Oral TID PRN Shrivastava, Aryendra, MD   5 mg at 02/06/24 1352   hydrOXYzine (ATARAX) tablet 25 mg  25 mg Oral Q6H PRN Shrivastava, Aryendra, MD       ibuprofen (ADVIL) tablet 200 mg  200 mg Oral Q6H PRN Shrivastava, Aryendra, MD    200 mg at 02/05/24 1455   lithium carbonate (ESKALITH) ER tablet 450 mg  450 mg Oral Q12H Shrivastava, Aryendra, MD   450 mg at 02/07/24 0817   loperamide (IMODIUM) capsule 2-4 mg  2-4 mg Oral PRN Shrivastava, Aryendra, MD       LORazepam (ATIVAN) tablet 1 mg  1 mg Oral Q6H PRN Shrivastava, Aryendra, MD       magnesium  oxide (MAG-OX) tablet 400 mg  400 mg Oral BID Jadapalle, Sree, MD   400 mg at 02/07/24 1610   menthol-cetylpyridinium (CEPACOL) lozenge 3 mg  1 lozenge Oral PRN Aniyah Nobis R, MD       multivitamin with minerals tablet 1 tablet  1 tablet Oral Daily Shrivastava, Aryendra, MD   1 tablet at 02/07/24 0817   nicotine (NICODERM CQ - dosed in mg/24 hours) patch 14 mg  14 mg Transdermal Daily Shrivastava, Aryendra, MD   14 mg at 02/07/24 0817   OLANZapine (ZYPREXA) injection 10 mg  10 mg Intramuscular TID PRN Shrivastava, Aryendra, MD       OLANZapine (ZYPREXA) injection 5 mg  5 mg Intramuscular TID PRN Shrivastava, Aryendra, MD       OLANZapine zydis (ZYPREXA) disintegrating tablet 5 mg  5 mg Oral TID PRN Shrivastava, Aryendra, MD       ondansetron (ZOFRAN-ODT) disintegrating tablet 4 mg  4 mg Oral Once Shrivastava, Aryendra, MD       ondansetron (ZOFRAN-ODT) disintegrating tablet 4 mg  4 mg Oral Q6H PRN Shrivastava, Aryendra, MD   4 mg at 02/06/24 1531   paliperidone (INVEGA) 24 hr tablet 6 mg  6 mg Oral Daily Shrivastava, Aryendra, MD   6 mg at 02/07/24 0817   simethicone (MYLICON) chewable tablet 80 mg  80 mg Oral BID PRN Bobbitt, Shalon E, NP   80 mg at 02/07/24 0849   thiamine (VITAMIN B1) tablet 100 mg  100 mg Oral Daily Shrivastava, Aryendra, MD   100 mg at 02/07/24 9604   traZODone  (DESYREL ) tablet 50 mg  50 mg Oral QHS PRN Shrivastava, Aryendra, MD        Lab Results:  No results found for this or any previous visit (from the past 48 hours).   Blood Alcohol level:  Lab Results  Component Value Date   [facility] <15 02/03/2024    Metabolic Disorder Labs: No results found  for: "HGBA1C", "MPG" No results found for: "PROLACTIN" Lab Results  Component Value Date   CHOL 169 02/23/2013   TRIG 60.0 02/23/2013   HDL 68.30 02/23/2013   CHOLHDL 2 02/23/2013   VLDL 12.0 02/23/2013   LDLCALC 89 02/23/2013   LDLCALC 101 (H) 02/18/2012    Physical Findings: AIMS:  , ,  ,  ,    CIWA:  CIWA-Ar Total: 0 COWS:  COWS Total Score: 1  Musculoskeletal: Strength & Muscle Tone: within normal limits Gait & Station: normal Patient leans: N/A       MENTAL STATUS EXAM: Appearance: Female, appearing stated age, in hospital clothing; wearing appropriate to the weather and situation, with  poor grooming and hygiene. Attitude/Behavior:  superficially cooperative, crying and with inconsistent eye contact. Speech was spontaneous , pressured Mood:  anxious , labile , crying Affect:  dysphoric and anxious.  congruent to the mood and appropriate to the situation. Thought content: patient denies suicidal thoughts, denies homicidal thoughts; did not express any delusions. Thought process:  mostly logical and goal-directed. Perception: patient denies auditory and visual hallucinations. Did not appear internally stimulated. Cognition: patient is alert and oriented in self, place, date; with intact abstract,  fund of knowledge -   Normal attention and concentration -  impaired Insight: poor Judgment: poor     Physical exam PHYSICAL EXAMINATION: GENERAL APPEARANCE: Well-developed, well-nourished. HEENT: Normocephalic.  Extraocular movements are within normal limits. Black spot noted on the right earlobe. NECK: Supple, nontender. No masses or enlarged thyroid. LUNGS: Clear to auscultation. HEART: Regular rate. No murmur or gallop. ABDOMEN: Soft, nontender. No masses or organomegaly. BREASTS & RECTAL: Not examined EXTREMITIES: Atraumatic. Negative cyanosis, clubbing or edema     NEUROLOGIC: Reveals no sensory or motor deficits. CN 2-12 intact   Review of Systems All  other systems reviewed and are negative. Blood pressure 117/82, pulse 86, temperature 97.7 F (36.5 C), resp. rate (!) 21, height 5\' 5"  (1.651 m), weight 67.6 kg, last menstrual period 01/28/2015, SpO2 96%. Body mass index is 24.79 kg/m.   Treatment Plan Summary:  Assessment/Plan Diagnosis- Suicidal ideation. Bipolar 1. PTSD. Alcohol use disorder. Cannabis use. Back pain. Colon cancer.     Assessment/Plan   1.    Safety and Monitoring:   --  Involuntary admission to inpatient psychiatric unit for safety, stabilization and treatment -- Daily contact with patient to assess and evaluate symptoms and progress in treatment -- Patient's case to be discussed in multi-disciplinary team meeting -- Observation Level : q15 minute checks -- Vital signs:  q12 hours -- Precautions: suicide   2. Psychiatric Diagnoses and Treatment: Continue lithium 450 mg twice daily, increase Invega to 6 mg today.   Continue patient Klonopin  as needed. Hold off on Adderall. Can consider starting patient on Minipress if needed. Continue CIWA protocol given history of alcohol use.  Patient denies any history of DTs or withdrawal seizures or any ICU admissions.   --  The risks/benefits/side-effects/alternatives to this medication were discussed in detail with the patient and time was given for questions. The patient consents to medication trial. -- Metabolic profile and EKG monitoring obtained while on an atypical antipsychotic  (BMI: 0.00, QTC: 0.00, HbA1c: 0.00, Lipid panel: 0.00) -- Encouraged patient to participate in unit milieu and in scheduled group therapies -- Short Term Goals: Ability to identify changes in lifestyle to reduce recurrence of condition will improve, Ability to verbalize feelings will improve, Ability to disclose and discuss suicidal ideas, Ability to demonstrate self-control will improve, Ability to identify and develop effective coping behaviors will improve, Ability to maintain  clinical measurements within normal limits will improve, Compliance with prescribed medications will improve, and Ability to identify triggers associated with substance abuse/mental health issues will improve -- Long Term Goals: Improvement in symptoms so as ready for discharge        3. Medical Issues Being Addressed: Back pain-continue Flexeril. Headache-continue patient on Fioricet Colon cancer-follow-up with PCP.  Consulted hospitalist : For medical optimization. as patient reports having loose black pleural stools given the history of colon cancer and reported.  Recurrence.  Also patient have chronic headache. Patient requesting to see her cardiologist, neurologist,-discussed with the patient that we have consulted hospitalist and if appropriate will involve other speciality at the recommendation.      4. Discharge Planning:   -- Social work and case management to assist with discharge planning and identification of hospital follow-up needs prior to discharge -- Estimated LOS: 5-7 days -- Discharge Concerns: Need to establish a safety plan; Medication compliance and effectiveness -- Discharge Goals: Return home with outpatient referrals for mental health follow-up including medication management/psychotherapy  Lindajo Resides, MD 02/06/2024, 8:52 AM

## 2024-02-07 NOTE — Group Note (Signed)
 Date:  02/07/2024 Time:  9:18 PM  Group Topic/Focus:  Orientation:   The focus of this group is to educate the patient on the purpose and policies of crisis stabilization and provide a format to answer questions about their admission.  The group details unit policies and expectations of patients while admitted.    Participation Level:  Active  Participation Quality:  Appropriate, Attentive, and Sharing  Affect:  Appropriate  Cognitive:  Alert and Appropriate  Insight: Appropriate, Good, and Improving  Engagement in Group:  Developing/Improving and Engaged  Modes of Intervention:  Clarification, Discussion, Education, Orientation, Problem-solving, Rapport Building, and Support  Additional Comments:     Manuella Blackson 02/07/2024, 9:18 PM

## 2024-02-07 NOTE — Plan of Care (Signed)
  Problem: Education: Goal: Emotional status will improve Outcome: Progressing Goal: Verbalization of understanding the information provided will improve Outcome: Progressing   Problem: Activity: Goal: Interest or engagement in activities will improve Outcome: Progressing   Problem: Coping: Goal: Ability to demonstrate self-control will improve Outcome: Progressing   Problem: Physical Regulation: Goal: Ability to maintain clinical measurements within normal limits will improve Outcome: Progressing   Problem: Safety: Goal: Periods of time without injury will increase Outcome: Progressing

## 2024-02-07 NOTE — Progress Notes (Signed)
 [facility] MD Progress Note  02/07/2024 9:21 PM Carla King  MRN:  [id number] 58 year old Caucasian female with a past psych history of bipolar 1, PTSD, substance abuse [alcohol, marijuana] and past medical history of colon cancer, back pain, questionable MS, recurrent colon cancer presented to the Hospital ED after she was IVC  by her son with concerns of suicidal thoughts.   Subjective: Per nursing report patient continues to be tearful and crying at times.     Principal Problem: Bipolar disorder (HCC) Diagnosis: Principal Problem:   Bipolar disorder [facility])   Past Medical History:  Past Medical History:  Diagnosis Date   alcohol    colon ca 2011    Past Surgical History:  Procedure Laterality Date   AUGMENTATION MAMMAPLASTY Bilateral    LAPAROSCOPIC PARTIAL COLECTOMY Left Feb 2004   Family History: History reviewed. No pertinent family history.  Social History:  Social History   Substance and Sexual Activity  Alcohol Use No     Social History   Substance and Sexual Activity  Drug Use Yes   Types: Marijuana    Social History   Socioeconomic History   Marital status: Married    Spouse name: Not on file   Number of children: Not on file   Years of education: Not on file   Highest education level: Not on file  Occupational History   Not on file  Tobacco Use   Smoking status: Never   Smokeless tobacco: Never  Vaping Use   Vaping status: Every Day  Substance and Sexual Activity   Alcohol use: No   Drug use: Yes    Types: Marijuana   Sexual activity: Yes  Other Topics Concern   Not on file  Social History Narrative   Not on file   Social Drivers of Health   Financial Resource Strain: Low Risk  (09/16/2023)   Received from [facility]   Overall Financial Resource Strain (CARDIA)    Difficulty of Paying Living Expenses: Not hard at all  Food Insecurity: Food Insecurity Present (02/04/2024)   Hunger Vital Sign    Worried About Running Out of  Food in the Last Year: Often true    Ran Out of Food in the Last Year: Often true  Transportation Needs: No Transportation Needs (02/04/2024)   PRAPARE - Administrator, Civil Service (Medical): No    Lack of Transportation (Non-Medical): No  Physical Activity: Not on file  Stress: Not on file  Social Connections: Socially Isolated (02/04/2024)   Social Connection and Isolation Panel [NHANES]    Frequency of Communication with Friends and Family: More than three times a week    Frequency of Social Gatherings with Friends and Family: Once a week    Attends Religious Services: Never    Database administrator or Organizations: No    Attends Engineer, structural: Never    Marital Status: Divorced   Additional Social History:      Current Medications: Current Facility-Administered Medications  Medication Dose Route Frequency Provider Last Rate Last Admin   acetaminophen  (TYLENOL ) tablet 650 mg  650 mg Oral Q6H PRN Shrivastava, Aryendra, MD   650 mg at 02/05/24 1455   butalbital-acetaminophen -caffeine (FIORICET) 50-325-40 MG per tablet 1 tablet  1 tablet Oral Q6H PRN Shrivastava, Aryendra, MD   1 tablet at 02/07/24 1516   clonazePAM  (KLONOPIN ) tablet 0.5 mg  0.5 mg Oral TID PRN Shrivastava, Aryendra, MD   0.5 mg at 02/06/24 0441  cyclobenzaprine (FLEXERIL) tablet 5 mg  5 mg Oral TID PRN Shrivastava, Aryendra, MD   5 mg at 02/06/24 1352   dicyclomine (BENTYL) tablet 20 mg  20 mg Oral TID AC & HS Dionta Larke, MD   20 mg at 02/07/24 1610   ibuprofen (ADVIL) tablet 200 mg  200 mg Oral Q6H PRN Shrivastava, Aryendra, MD   200 mg at 02/05/24 1455   lithium carbonate (ESKALITH) ER tablet 450 mg  450 mg Oral Q12H Shrivastava, Aryendra, MD   450 mg at 02/07/24 4696   magnesium  oxide (MAG-OX) tablet 400 mg  400 mg Oral BID Demetries Coia, MD   400 mg at 02/07/24 1706   menthol-cetylpyridinium (CEPACOL) lozenge 3 mg  1 lozenge Oral PRN Madaram, Kondal R, MD       multivitamin  with minerals tablet 1 tablet  1 tablet Oral Daily Shrivastava, Aryendra, MD   1 tablet at 02/07/24 0817   nicotine (NICODERM CQ - dosed in mg/24 hours) patch 14 mg  14 mg Transdermal Daily Shrivastava, Aryendra, MD   14 mg at 02/07/24 0817   OLANZapine (ZYPREXA) injection 10 mg  10 mg Intramuscular TID PRN Shrivastava, Aryendra, MD       OLANZapine (ZYPREXA) injection 5 mg  5 mg Intramuscular TID PRN Shrivastava, Aryendra, MD       OLANZapine zydis (ZYPREXA) disintegrating tablet 5 mg  5 mg Oral TID PRN Shrivastava, Aryendra, MD       ondansetron (ZOFRAN-ODT) disintegrating tablet 4 mg  4 mg Oral QID Pudota, Kingsley P, MD   4 mg at 02/07/24 1610   paliperidone (INVEGA) 24 hr tablet 6 mg  6 mg Oral Daily Shrivastava, Aryendra, MD   6 mg at 02/07/24 0817   polyethylene glycol (MIRALAX / GLYCOLAX) packet 17 g  17 g Oral Daily Pudota, Kingsley P, MD   17 g at 02/07/24 1752   simethicone (MYLICON) chewable tablet 80 mg  80 mg Oral BID PRN Bobbitt, Shalon E, NP   80 mg at 02/07/24 0849   thiamine (VITAMIN B1) tablet 100 mg  100 mg Oral Daily Shrivastava, Aryendra, MD   100 mg at 02/07/24 0817   traMADol (ULTRAM) tablet 50 mg  50 mg Oral Q12H PRN Bonni Neuser, MD   50 mg at 02/07/24 1041   traZODone  (DESYREL ) tablet 50 mg  50 mg Oral QHS PRN Shrivastava, Aryendra, MD        Lab Results:  Results for orders placed or performed during the hospital encounter of 02/04/24 (from the past 48 hours)  Comprehensive metabolic panel with GFR     Status: Abnormal   Collection Time: 02/07/24 12:03 PM  Result Value Ref Range   Sodium 137 135 - 145 mmol/L   Potassium 4.3 3.5 - 5.1 mmol/L   Chloride 104 98 - 111 mmol/L   CO2 28 22 - 32 mmol/L   Glucose, Bld 96 70 - 99 mg/dL    Comment: Glucose reference range applies only to samples taken after fasting for at least 8 hours.   BUN 22 (H) 6 - 20 mg/dL   Creatinine, Ser 2.95 0.44 - 1.00 mg/dL   Calcium 9.1 8.9 - 28.4 mg/dL   Total Protein 6.9 6.5 - 8.1 g/dL    Albumin 3.9 3.5 - 5.0 g/dL   AST 20 15 - 41 U/L   ALT 21 0 - 44 U/L   Alkaline Phosphatase 59 38 - 126 U/L   Total Bilirubin 0.4 0.0 - 1.2 mg/dL  GFR, Estimated >60 >60 mL/min    Comment: (NOTE) Calculated using the CKD-EPI Creatinine Equation (2021)    Anion gap 5 5 - 15    Comment: Performed at [facility], [address] [place], [place] [address]  CBC with Differential/Platelet     Status: None   Collection Time: 02/07/24 12:03 PM  Result Value Ref Range   WBC 6.1 4.0 - 10.5 K/uL   RBC 4.02 3.87 - 5.11 MIL/uL   Hemoglobin 12.8 12.0 - 15.0 g/dL   HCT 62.1 30.8 - 65.7 %   MCV 94.8 80.0 - 100.0 fL   MCH 31.8 26.0 - 34.0 pg   MCHC 33.6 30.0 - 36.0 g/dL   RDW 84.6 96.2 - 95.2 %   Platelets 247 150 - 400 K/uL   nRBC 0.0 0.0 - 0.2 %   Neutrophils Relative % 67 %   Neutro Abs 4.2 1.7 - 7.7 K/uL   Lymphocytes Relative 20 %   Lymphs Abs 1.3 0.7 - 4.0 K/uL   Monocytes Relative 8 %   Monocytes Absolute 0.5 0.1 - 1.0 K/uL   Eosinophils Relative 3 %   Eosinophils Absolute 0.2 0.0 - 0.5 K/uL   Basophils Relative 1 %   Basophils Absolute 0.0 0.0 - 0.1 K/uL   Immature Granulocytes 1 %   Abs Immature Granulocytes 0.03 0.00 - 0.07 K/uL    Comment: Performed at [facility], [address] [place], [place] [address]    Blood Alcohol level:  Lab Results  Component Value Date   [facility] <15 02/03/2024    Metabolic Disorder Labs: No results found for: "HGBA1C", "MPG" No results found for: "PROLACTIN" Lab Results  Component Value Date   CHOL 169 02/23/2013   TRIG 60.0 02/23/2013   HDL 68.30 02/23/2013   CHOLHDL 2 02/23/2013   VLDL 12.0 02/23/2013   LDLCALC 89 02/23/2013   LDLCALC 101 (H) 02/18/2012    Physical Findings: AIMS:  , ,  ,  ,    CIWA:  CIWA-Ar Total: 0 COWS:  COWS Total Score: 1  Musculoskeletal: Strength & Muscle Tone: within normal limits Gait & Station: normal Patient leans: N/A       MENTAL STATUS EXAM: Appearance: Female,  appearing stated age,  Attitude/Behavior:  superficially cooperative, crying and with inconsistent eye contact. Speech was spontaneous , pressured Mood:  anxious , labile , crying Affect:  dysphoric and anxious.  congruent to the mood and appropriate to the situation. Thought content: patient denies suicidal thoughts, denies homicidal thoughts; did not express any delusions. Thought process:  mostly logical and goal-directed. Perception: patient denies auditory and visual hallucinations. Did not appear internally stimulated. Cognition: patient is alert and oriented in self, place, date; with intact abstract,  fund of knowledge -   Normal attention and concentration -  impaired Insight: poor Judgment: poor     Physical exam PHYSICAL EXAMINATION: GENERAL APPEARANCE: Well-developed, well-nourished. HEENT: Normocephalic.  Extraocular movements are within normal limits. Black spot noted on the right earlobe. NECK: Supple, nontender. No masses or enlarged thyroid. LUNGS: Clear to auscultation. HEART: Regular rate. No murmur or gallop. ABDOMEN: Soft, nontender. No masses or organomegaly. BREASTS & RECTAL: Not examined EXTREMITIES: Atraumatic. Negative cyanosis, clubbing or edema     NEUROLOGIC: Reveals no sensory or motor deficits. CN 2-12 intact   Review of Systems All other systems reviewed and are negative. Blood pressure 118/77, pulse 77, temperature (!) 96.6 F (35.9 C), resp. rate (!) 21, height 5\' 5"  (1.651  m), weight 67.6 kg, last menstrual period 01/28/2015, SpO2 100%. Body mass index is 24.79 kg/m.   Treatment Plan Summary:  Assessment/Plan Diagnosis- Suicidal ideation. Bipolar 1. PTSD. Alcohol use disorder. Cannabis use. Back pain. Colon cancer.     Assessment/Plan   1.    Safety and Monitoring:   --  Involuntary admission to inpatient psychiatric unit for safety, stabilization and treatment -- Daily contact with patient to assess and evaluate symptoms and  progress in treatment -- Patient's case to be discussed in multi-disciplinary team meeting -- Observation Level : q15 minute checks -- Vital signs:  q12 hours -- Precautions: suicide   2. Psychiatric Diagnoses and Treatment: Continue lithium 450 mg twice daily, increase Invega  6 mg today.   Continue patient Klonopin  as needed. Hold off on Adderall. Can consider starting patient on Minipress if needed. Continue CIWA protocol given history of alcohol use.  Patient denies any history of DTs or withdrawal seizures or any ICU admissions.   --  The risks/benefits/side-effects/alternatives to this medication were discussed in detail with the patient and time was given for questions. The patient consents to medication trial. -- Metabolic profile and EKG monitoring obtained while on an atypical antipsychotic  (BMI: 0.00, QTC: 0.00, HbA1c: 0.00, Lipid panel: 0.00) -- Encouraged patient to participate in unit milieu and in scheduled group therapies -- Short Term Goals: Ability to identify changes in lifestyle to reduce recurrence of condition will improve, Ability to verbalize feelings will improve, Ability to disclose and discuss suicidal ideas, Ability to demonstrate self-control will improve, Ability to identify and develop effective coping behaviors will improve, Ability to maintain clinical measurements within normal limits will improve, Compliance with prescribed medications will improve, and Ability to identify triggers associated with substance abuse/mental health issues will improve -- Long Term Goals: Improvement in symptoms so as ready for discharge        3. Medical Issues Being Addressed: Back pain-continue Flexeril. Headache-continue patient on Fioricet Colon cancer-follow-up with PCP.   Consulted hospitalist : For medical optimization. as patient reports having loose black pleural stools given the history of colon cancer and reported.  Recurrence.  Also patient have chronic  headache.       4. Discharge Planning:   -- Social work and case management to assist with discharge planning and identification of hospital follow-up needs prior to discharge -- Estimated LOS: 5-7 days -- Discharge Concerns: Need to establish a safety plan; Medication compliance and effectiveness -- Discharge Goals: Return home with outpatient referrals for mental health follow-up including medication management/psychotherapy  Aurelia Blotter, MD 02/07/2024, 9:21 PM

## 2024-02-08 ENCOUNTER — Other Ambulatory Visit: Payer: Self-pay

## 2024-02-08 DIAGNOSIS — F3132 Bipolar disorder, current episode depressed, moderate: Secondary | ICD-10-CM | POA: Diagnosis not present

## 2024-02-08 DIAGNOSIS — F319 Bipolar disorder, unspecified: Secondary | ICD-10-CM

## 2024-02-08 DIAGNOSIS — F419 Anxiety disorder, unspecified: Secondary | ICD-10-CM

## 2024-02-08 DIAGNOSIS — R45851 Suicidal ideations: Secondary | ICD-10-CM

## 2024-02-08 DIAGNOSIS — F1091 Alcohol use, unspecified, in remission: Secondary | ICD-10-CM

## 2024-02-08 MED ORDER — NICOTINE 14 MG/24HR TD PT24: 14.0000 mg | MEDICATED_PATCH | Freq: Every day | TRANSDERMAL | 0 refills | Status: DC

## 2024-02-08 MED ORDER — CLONAZEPAM 0.5 MG PO TABS: 0.5000 mg | ORAL_TABLET | Freq: Three times a day (TID) | ORAL | 0 refills | Status: DC | PRN

## 2024-02-08 MED ORDER — VITAMIN B-1 100 MG PO TABS: 100.0000 mg | ORAL_TABLET | Freq: Every day | ORAL | 0 refills | Status: DC

## 2024-02-08 MED ORDER — LITHIUM CARBONATE ER 450 MG PO TBCR: 450.0000 mg | EXTENDED_RELEASE_TABLET | Freq: Two times a day (BID) | ORAL | 0 refills | Status: DC

## 2024-02-08 MED ORDER — PALIPERIDONE ER 6 MG PO TB24: 6.0000 mg | ORAL_TABLET | Freq: Every day | ORAL | 0 refills | Status: DC

## 2024-02-08 MED ORDER — BUTALBITAL-APAP-CAFFEINE 50-325-40 MG PO TABS: 1.0000 | ORAL_TABLET | Freq: Four times a day (QID) | ORAL | 0 refills | Status: DC | PRN

## 2024-02-08 MED ORDER — DICYCLOMINE HCL 20 MG PO TABS: 20.0000 mg | ORAL_TABLET | Freq: Three times a day (TID) | ORAL | 0 refills | Status: DC

## 2024-02-08 NOTE — Group Note (Signed)
 LCSW Group Therapy Note  Group Date: 02/08/2024 Start Time: 1300 End Time: 1400   Type of Therapy and Topic:  Group Therapy: Positive Affirmations  Participation Level:  Did Not Attend   Description of Group:   This group addressed positive affirmation towards self and others.  Patients went around the room and identified two positive things about themselves and two positive things about a peer in the room.  Patients reflected on how it felt to share something positive with others, to identify positive things about themselves, and to hear positive things from others/ Patients were encouraged to have a daily reflection of positive characteristics or circumstances.   Therapeutic Goals: Patients will verbalize two of their positive qualities Patients will demonstrate empathy for others by stating two positive qualities about a peer in the group Patients will verbalize their feelings when voicing positive self affirmations and when voicing positive affirmations of others Patients will discuss the potential positive impact on their wellness/recovery of focusing on positive traits of self and others.  Summary of Patient Progress: Patient did not attend group.   Therapeutic Modalities:   Cognitive Behavioral Therapy Motivational Interviewing    Roselle Conner, Milinda Allen 02/08/2024  1:29 PM

## 2024-02-08 NOTE — Discharge Summary (Signed)
 Physician Discharge Summary Note  Patient:  Carla King is an 58 y.o., female MRN:  [id number] DOB:  Jan 16, 1966 Patient phone:  [phone] (home)  Patient address:   [address] [place] [facility] [address],    Date of Admission:  02/04/2024 Date of Discharge: 02/08/24  Reason for Admission:  58 year old Caucasian female with a past psych history of bipolar 1, PTSD, substance abuse [alcohol, marijuana] and past medical history of colon cancer, back pain, questionable MS, recurrent colon cancer presented to the Hospital ED after she was IVC  by her son with concerns of suicidal thoughts.   Principal Problem: Bipolar disorder [facility]) Discharge Diagnoses: Principal Problem:   Bipolar disorder (HCC)   Past Psychiatric History: see h&p  Family Psychiatric  History: see h&p Social History:  Social History   Substance and Sexual Activity  Alcohol Use No     Social History   Substance and Sexual Activity  Drug Use Yes   Types: Marijuana    Social History   Socioeconomic History   Marital status: Married    Spouse name: Not on file   Number of children: Not on file   Years of education: Not on file   Highest education level: Not on file  Occupational History   Not on file  Tobacco Use   Smoking status: Never   Smokeless tobacco: Never  Vaping Use   Vaping status: Every Day  Substance and Sexual Activity   Alcohol use: No   Drug use: Yes    Types: Marijuana   Sexual activity: Yes  Other Topics Concern   Not on file  Social History Narrative   Not on file   Social Drivers of Health   Financial Resource Strain: Low Risk  (09/16/2023)   Received from [facility]   Overall Financial Resource Strain (CARDIA)    Difficulty of Paying Living Expenses: Not hard at all  Food Insecurity: Food Insecurity Present (02/04/2024)   Hunger Vital Sign    Worried About Running Out of Food in the Last Year: Often true    Ran Out of Food in the Last Year: Often true   Transportation Needs: No Transportation Needs (02/04/2024)   PRAPARE - Administrator, Civil Service (Medical): No    Lack of Transportation (Non-Medical): No  Physical Activity: Not on file  Stress: Not on file  Social Connections: Socially Isolated (02/04/2024)   Social Connection and Isolation Panel [NHANES]    Frequency of Communication with Friends and Family: More than three times a week    Frequency of Social Gatherings with Friends and Family: Once a week    Attends Religious Services: Never    Database administrator or Organizations: No    Attends Engineer, structural: Never    Marital Status: Divorced   Past Medical History:  Past Medical History:  Diagnosis Date   alcohol    colon ca 2011    Past Surgical History:  Procedure Laterality Date   AUGMENTATION MAMMAPLASTY Bilateral    LAPAROSCOPIC PARTIAL COLECTOMY Left Feb 2004   Family History: History reviewed. No pertinent family history.  Hospital Course:  58 year old Caucasian female with a past psych history of bipolar 1, PTSD, substance abuse [alcohol, marijuana] and past medical history of colon cancer, back pain, questionable MS, recurrent colon cancer presented to the Hospital ED after she was IVC  by her son with concerns of suicidal thoughts. Patient is admitted to adult psych unit with Q15  min safety monitoring. Multidisciplinary team approach is offered. Medication management; group/milieu therapy is offered.  Admission patient was continued on her home Klonopin , Adderall was put on hold, patient was started on lithium  and Invega .  Lithium  was dosed at 450 mg twice daily and Invega  was increased to 6 mg daily for mood stabilization.  Patient was complaining of acute abdomen since admission and hospitalist consult was called.  CT abdomen revealed normal abdominal with no other acute concerns.  Patient insisted on discharge saying she is feeling a lot better and denies SI/HI/plan.  She wants to  pursue her treatment as outpatient at this time.  Social work team has reached out to the family and had no further safety concerns.  She is noted to have good family support.  On the day of discharge patient consistently denies SI/HI/intent/plan and denied hallucinations.  She remained future oriented and is willing to participate in outpatient mental health services.  Physical Findings: AIMS:  , ,  ,  ,    CIWA:  CIWA-Ar Total: 0 COWS:  COWS Total Score: 1     Psychiatric Specialty Exam:  Presentation  General Appearance:  Appropriate for Environment; Casual  Eye Contact: Fair  Speech: Clear and Coherent  Speech Volume: Normal    Mood and Affect  Mood: Euthymic  Affect: Appropriate   Thought Process  Thought Processes: Coherent  Descriptions of Associations:Intact  Orientation:Full (Time, Place and Person)  Thought Content:Logical  Hallucinations:Hallucinations: None  Ideas of Reference:None  Suicidal Thoughts:Suicidal Thoughts: No  Homicidal Thoughts:Homicidal Thoughts: No   Sensorium  Memory: Immediate Fair; Recent Fair; Remote Fair  Judgment: Fair  Insight: Fair   Art therapist  Concentration: Fair  Attention Span: Fair  Recall: [employer] of Knowledge: Fair  Language: Fair   Psychomotor Activity  Psychomotor Activity: Psychomotor Activity: Normal  Musculoskeletal: Strength & Muscle Tone: within normal limits Gait & Station: normal Assets  Assets: Manufacturing systems engineer; Desire for Improvement; Resilience   Sleep  Sleep: Sleep: Fair    Physical Exam: Physical Exam ROS Blood pressure (!) 146/87, pulse 68, temperature 97.7 F (36.5 C), resp. rate 19, height 5\' 5"  (1.651 m), weight 67.6 kg, last menstrual period 01/28/2015, SpO2 100%. Body mass index is 24.79 kg/m.   Social History   Tobacco Use  Smoking Status Never  Smokeless Tobacco Never   Tobacco Cessation:  N/A, patient does not currently use  tobacco products   Blood Alcohol level:  Lab Results  Component Value Date   [facility] <15 02/03/2024    Metabolic Disorder Labs:  No results found for: "HGBA1C", "MPG" No results found for: "PROLACTIN" Lab Results  Component Value Date   CHOL 169 02/23/2013   TRIG 60.0 02/23/2013   HDL 68.30 02/23/2013   CHOLHDL 2 02/23/2013   VLDL 12.0 02/23/2013   LDLCALC 89 02/23/2013   LDLCALC 101 (H) 02/18/2012    See Psychiatric Specialty Exam and Suicide Risk Assessment completed by Attending Physician prior to discharge.  Discharge destination:  Home  Is patient on multiple antipsychotic therapies at discharge:  No   Has Patient had three or more failed trials of antipsychotic monotherapy by history:  No  Recommended Plan for Multiple Antipsychotic Therapies: NA  Discharge Instructions     Diet - low sodium heart healthy   Complete by: As directed    Increase activity slowly   Complete by: As directed       Allergies as of 02/08/2024       Reactions  Droperidol    Magnesium -containing Compounds Other (See Comments)   **IV ONLY** TABLETS OKAY** "Hallucinations, pain at injection site, makes me go crazy"        Medication List     STOP taking these medications    acyclovir 800 MG tablet Commonly known as: ZOVIRAX   amitriptyline  25 MG tablet Commonly known as: ELAVIL    amphetamine-dextroamphetamine 15 MG 24 hr capsule Commonly known as: ADDERALL XR   ibuprofen  200 MG tablet Commonly known as: ADVIL    magnesium  oxide 400 MG tablet Commonly known as: MAG-OX   PARoxetine  20 MG tablet Commonly known as: PAXIL    prochlorperazine  5 MG tablet Commonly known as: COMPAZINE    traZODone  100 MG tablet Commonly known as: DESYREL    zolpidem  10 MG tablet Commonly known as: AMBIEN        TAKE these medications      Indication  butalbital -acetaminophen -caffeine  50-325-40 MG tablet Commonly known as: FIORICET  Take 1 tablet by mouth every 6 (six) hours as  needed for migraine. What changed:  how much to take when to take this reasons to take this  Indication: Tension Headache   clonazePAM  0.5 MG tablet Commonly known as: KlonoPIN  Take 1 tablet (0.5 mg total) by mouth 3 (three) times daily as needed (severe anxiety). What changed:  when to take this reasons to take this  Indication: Feeling Anxious   cyclobenzaprine  5 MG tablet Commonly known as: FLEXERIL  Take 5 mg by mouth 3 (three) times daily as needed.  Indication: Fibromyalgia Syndrome   dicyclomine  20 MG tablet Commonly known as: BENTYL  Take 1 tablet (20 mg total) by mouth 4 (four) times daily -  before meals and at bedtime.  Indication: Irritable Bowel Syndrome   lithium  carbonate 450 MG ER tablet Commonly known as: ESKALITH  Take 1 tablet (450 mg total) by mouth every 12 (twelve) hours.  Indication: Manic-Depression   nicotine  14 mg/24hr patch Commonly known as: NICODERM CQ  - dosed in mg/24 hours Place 1 patch (14 mg total) onto the skin daily. Start taking on: Feb 09, 2024  Indication: Nicotine  Addiction   paliperidone  6 MG 24 hr tablet Commonly known as: INVEGA  Take 1 tablet (6 mg total) by mouth daily. Start taking on: Feb 09, 2024  Indication: Schizoaffective Disorder   thiamine  100 MG tablet Commonly known as: Vitamin B-1 Take 1 tablet (100 mg total) by mouth daily. Start taking on: Feb 09, 2024  Indication: Deficiency of Vitamin B1        Follow-up Information     [facility], [facility]. Go to.   Why: In person assessment for therapy and medication management is 02/10/24 at 11 AM. Contact information: [address] [place] [address] [phone]                 Follow-up recommendations:  Activity:  As tolerated    Signed: Maysoon Lozada, MD 02/08/2024, 11:54 AM

## 2024-02-08 NOTE — Progress Notes (Signed)
 Pt woke up having a panic attack. PRN medication administered per MD orders, check MAR. Pt remains safe on the unit

## 2024-02-08 NOTE — Group Note (Signed)
 Date:  02/08/2024 Time:  10:22 AM  Group Topic/Focus:  Goals Group:   The focus of this group is to help patients establish daily goals to achieve during treatment and discuss how the patient can incorporate goal setting into their daily lives to aide in recovery.    Participation Level:  Active  Participation Quality:  Appropriate  Affect:  Appropriate  Cognitive:  Appropriate  Insight: Appropriate  Engagement in Group:  Engaged  Modes of Intervention:  Discussion, Education, and Support  Additional Comments:    Laverne Potter 02/08/2024, 10:22 AM

## 2024-02-08 NOTE — Progress Notes (Signed)
 Brief hospitalist update note.  Nonbillable note.  58 year old female admitted to inpatient psychiatric service.  [facility] hospitalist service consulted on 5/27 for complaints of crampy abdominal pain associated with loose black stools.  Laboratory and imaging data reviewed.  Stable hemoglobin.  Likely not indicative of ongoing active GI bleed.  CT abdomen pelvis negative for acute intra-abdominal pathology.  Patient has been started on Bentyl 20 mg 4 times daily.  I am in agreement with this.  [facility] hospitalist service to follow peripherally at this time.  Please reach out to us  with any ongoing questions or concerns.  Thank you for the consult.  Case discussed with primary psychiatric attending Dr. Bartley Lightning MD  No charge

## 2024-02-08 NOTE — Progress Notes (Addendum)
  [facility] Adult Case Management Discharge Plan :  Will you be returning to the same living situation after discharge:  Yes,  Patient to return home.  At discharge, do you have transportation home?: Yes,  Patient's family to provide transportation.  Do you have the ability to pay for your medications: Yes,  BLUE CROSS BLUE SHIELD / BCBS COMM PPO  Release of information consent forms completed and in the chart;  Patient's signature needed at discharge.  Patient to Follow up at:  Follow-up Information     [facility], [facility]. Go to.   Why: In person assessment for therapy and medication management is 02/10/24 at 11 AM. Contact information: [address] [place] [address] [phone]                 Next level of care provider has access to [facility] Link:no  Safety Planning and Suicide Prevention discussed: Yes,  Education Completed; [place] Standing [phone]), has been identified by the patient as the family member/significant other with whom the patient will be residing, and identified as the person(s) who will aid the patient in the event of a mental health crisis (suicidal ideations/suicide attempt).  With written consent from the patient, the family member/significant other has been provided the following suicide prevention education, prior to the and/or following the discharge of the patient.     Has patient been referred to the Quitline?: Patient refused referral for treatment  Patient has been referred for addiction treatment: Yes, the patient will follow up with an outpatient provider for substance use disorder. Psychiatrist/APP: appointment made and Therapist: appointment made  Roselle Conner, LCSW 02/08/2024, 11:17 AM

## 2024-02-08 NOTE — BHH Suicide Risk Assessment (Signed)
 [facility] Discharge Suicide Risk Assessment   Principal Problem: Bipolar disorder [facility]) Discharge Diagnoses: Principal Problem:   Bipolar disorder (HCC)   Total Time spent with patient: 30 minutes  Musculoskeletal: Strength & Muscle Tone: within normal limits Gait & Station: normal Patient leans: N/A  Psychiatric Specialty Exam  Presentation  General Appearance:  Appropriate for Environment; Casual  Eye Contact: Fair  Speech: Clear and Coherent  Speech Volume: Normal  Handedness: Right   Mood and Affect  Mood: Euthymic  Duration of Depression Symptoms: Greater than two weeks  Affect: Appropriate   Thought Process  Thought Processes: Coherent  Descriptions of Associations:Intact  Orientation:Full (Time, Place and Person)  Thought Content:Logical  History of Schizophrenia/Schizoaffective disorder:No  Duration of Psychotic Symptoms:Greater than six months  Hallucinations:Hallucinations: None  Ideas of Reference:None  Suicidal Thoughts:Suicidal Thoughts: No  Homicidal Thoughts:Homicidal Thoughts: No   Sensorium  Memory: Immediate Fair; Recent Fair; Remote Fair  Judgment: Fair  Insight: Fair   Art therapist  Concentration: Fair  Attention Span: Fair  Recall: [employer] of Knowledge: Fair  Language: Fair   Psychomotor Activity  Psychomotor Activity: Psychomotor Activity: Normal   Assets  Assets: Communication Skills; Desire for Improvement; Resilience   Sleep  Sleep: Sleep: Fair   Physical Exam: Physical Exam ROS Blood pressure (!) 146/87, pulse 68, temperature 97.7 F (36.5 C), resp. rate 19, height 5\' 5"  (1.651 m), weight 67.6 kg, last menstrual period 01/28/2015, SpO2 100%. Body mass index is 24.79 kg/m.  Mental Status Per Nursing Assessment::   On Admission:  Suicidal ideation indicated by patient, Self-harm thoughts  Demographic Factors:  Caucasian  Loss Factors: Decrease in vocational  status  Historical Factors: Impulsivity  Risk Reduction Factors:   Living with another person, especially a relative, Positive social support, Positive therapeutic relationship, and Positive coping skills or problem solving skills  Continued Clinical Symptoms:  Bipolar Disorder:   Depressive phase  Cognitive Features That Contribute To Risk:  None    Suicide Risk:  Minimal: No identifiable suicidal ideation.  Patients presenting with no risk factors but with morbid ruminations; may be classified as minimal risk based on the severity of the depressive symptoms   Follow-up Information     [facility], [facility]. Go to.   Why: In person assessment for therapy and medication management is 02/10/24 at 11 AM. Contact information: [address] [place] [address] [phone]                 Plan Of Care/Follow-up recommendations:  Activity:  As tolerated  Albie Bazin, MD 02/08/2024, 11:53 AM

## 2024-02-08 NOTE — Progress Notes (Signed)
 Patient is discharging at this time. Patient is A&Ox4. Stable. Patient denies SI,HI, and A/V/H with no plan/intent. Printed AVS reviewed with and given to patient along with medications and follow up appointments. Suicide safety plan complete with copy provided to patient. Original form in assigned binder. Patient verbalized all understanding. All valuables/belongings returned to patient. Patient is being transported by her brother in law. Patient denies any pain/discomfort. No s/s of current distress.

## 2024-03-02 ENCOUNTER — Telehealth: Payer: Self-pay | Admitting: Internal Medicine

## 2024-03-02 NOTE — Telephone Encounter (Signed)
 Copied from CRM [id number]. Topic: Appointments - Appointment Scheduling >> Mar 02, 2024 11:15 AM Allyne Areola wrote: Patient/patient representative is calling to schedule an appointment. Refer to attachments for appointment information. Patient was referred to schedule an appointment with Dr.Scott by Dr.Scott's son Lynder Sanger. She is looking for a new primary care provider and would like to know if Dr.Scott would make an exception in accepting her as a new patient.

## 2024-04-27 ENCOUNTER — Ambulatory Visit: Payer: Self-pay | Admitting: Internal Medicine

## 2024-07-09 ENCOUNTER — Other Ambulatory Visit: Payer: Self-pay

## 2024-07-09 ENCOUNTER — Ambulatory Visit (HOSPITAL_COMMUNITY)
Admission: EM | Admit: 2024-07-09 | Discharge: 2024-07-09 | Disposition: A | Discharge status: Other Acute Inpatient Hospital | Payer: 59

## 2024-07-09 ENCOUNTER — Inpatient Hospital Stay
Admission: AD | Admit: 2024-07-09 | Discharge: 2024-07-20 | DRG: 885 | Disposition: A | Discharge status: Home / Self Care | Payer: MEDICAID | Source: Intra-hospital | Attending: Psychiatry | Admitting: Psychiatry

## 2024-07-09 ENCOUNTER — Encounter: Payer: Self-pay | Admitting: Psychiatry

## 2024-07-09 DIAGNOSIS — F332 Major depressive disorder, recurrent severe without psychotic features: Secondary | ICD-10-CM

## 2024-07-09 DIAGNOSIS — F419 Anxiety disorder, unspecified: Secondary | ICD-10-CM | POA: Diagnosis not present

## 2024-07-09 DIAGNOSIS — F319 Bipolar disorder, unspecified: Secondary | ICD-10-CM | POA: Diagnosis not present

## 2024-07-09 DIAGNOSIS — Z818 Family history of other mental and behavioral disorders: Secondary | ICD-10-CM

## 2024-07-09 DIAGNOSIS — F5104 Psychophysiologic insomnia: Secondary | ICD-10-CM | POA: Diagnosis present

## 2024-07-09 DIAGNOSIS — Z85038 Personal history of other malignant neoplasm of large intestine: Secondary | ICD-10-CM

## 2024-07-09 DIAGNOSIS — F17290 Nicotine dependence, other tobacco product, uncomplicated: Secondary | ICD-10-CM | POA: Diagnosis present

## 2024-07-09 DIAGNOSIS — F331 Major depressive disorder, recurrent, moderate: Secondary | ICD-10-CM

## 2024-07-09 DIAGNOSIS — Z23 Encounter for immunization: Secondary | ICD-10-CM | POA: Diagnosis not present

## 2024-07-09 DIAGNOSIS — F410 Panic disorder [episodic paroxysmal anxiety] without agoraphobia: Secondary | ICD-10-CM | POA: Diagnosis present

## 2024-07-09 DIAGNOSIS — Z1152 Encounter for screening for COVID-19: Secondary | ICD-10-CM

## 2024-07-09 DIAGNOSIS — Z9151 Personal history of suicidal behavior: Secondary | ICD-10-CM

## 2024-07-09 DIAGNOSIS — Z634 Disappearance and death of family member: Secondary | ICD-10-CM | POA: Diagnosis not present

## 2024-07-09 DIAGNOSIS — G43909 Migraine, unspecified, not intractable, without status migrainosus: Secondary | ICD-10-CM | POA: Insufficient documentation

## 2024-07-09 DIAGNOSIS — Z87891 Personal history of nicotine dependence: Secondary | ICD-10-CM | POA: Insufficient documentation

## 2024-07-09 LAB — CBC WITH DIFFERENTIAL/PLATELET
Abs Immature Granulocytes: 0.01 K/uL (ref 0.00–0.07)
Basophils Absolute: 0 K/uL (ref 0.0–0.1)
Basophils Relative: 1 %
Eosinophils Absolute: 0 K/uL (ref 0.0–0.5)
Eosinophils Relative: 1 %
HCT: 43.7 % (ref 36.0–46.0)
Hemoglobin: 15 g/dL (ref 12.0–15.0)
Immature Granulocytes: 0 %
Lymphocytes Relative: 28 %
Lymphs Abs: 1.4 K/uL (ref 0.7–4.0)
MCH: 31.6 pg (ref 26.0–34.0)
MCHC: 34.3 g/dL (ref 30.0–36.0)
MCV: 92 fL (ref 80.0–100.0)
Monocytes Absolute: 0.5 K/uL (ref 0.1–1.0)
Monocytes Relative: 9 %
Neutro Abs: 3.2 K/uL (ref 1.7–7.7)
Neutrophils Relative %: 61 %
Platelets: 337 K/uL (ref 150–400)
RBC: 4.75 MIL/uL (ref 3.87–5.11)
RDW: 12.7 % (ref 11.5–15.5)
WBC: 5.2 K/uL (ref 4.0–10.5)
nRBC: 0 % (ref 0.0–0.2)

## 2024-07-09 LAB — POCT URINE DRUG SCREEN - MANUAL ENTRY (I-SCREEN)
POC Amphetamine UR: NOT DETECTED
POC Buprenorphine (BUP): NOT DETECTED
POC Cocaine UR: NOT DETECTED
POC Marijuana UR: POSITIVE — AB
POC Methadone UR: NOT DETECTED
POC Methamphetamine UR: NOT DETECTED
POC Morphine: NOT DETECTED
POC Oxazepam (BZO): NOT DETECTED
POC Oxycodone UR: NOT DETECTED
POC Secobarbital (BAR): NOT DETECTED

## 2024-07-09 LAB — COMPREHENSIVE METABOLIC PANEL WITH GFR
ALT: 11 U/L (ref 0–44)
AST: 15 U/L (ref 15–41)
Albumin: 4.1 g/dL (ref 3.5–5.0)
Alkaline Phosphatase: 55 U/L (ref 38–126)
Anion gap: 15 (ref 5–15)
BUN: 12 mg/dL (ref 6–20)
CO2: 27 mmol/L (ref 22–32)
Calcium: 9.8 mg/dL (ref 8.9–10.3)
Chloride: 98 mmol/L (ref 98–111)
Creatinine, Ser: 0.53 mg/dL (ref 0.44–1.00)
GFR, Estimated: >60 mL/min (ref >60)
Glucose, Bld: 85 mg/dL (ref 70–99)
Potassium: 3.8 mmol/L (ref 3.5–5.1)
Sodium: 140 mmol/L (ref 135–145)
Total Bilirubin: 1 mg/dL (ref 0.0–1.2)
Total Protein: 6.9 g/dL (ref 6.5–8.1)

## 2024-07-09 LAB — TSH: TSH: 1.278 u[IU]/mL (ref 0.350–4.500)

## 2024-07-09 LAB — ETHANOL: Alcohol, Ethyl (B): <15 mg/dL (ref <15)

## 2024-07-09 LAB — RESP PANEL BY RT-PCR (RSV, FLU A&B, COVID)  RVPGX2
Influenza A by PCR: NEGATIVE
Influenza B by PCR: NEGATIVE
Resp Syncytial Virus by PCR: NEGATIVE
SARS Coronavirus 2 by RT PCR: NEGATIVE

## 2024-07-09 LAB — MAGNESIUM: Magnesium: 2.2 mg/dL (ref 1.7–2.4)

## 2024-07-09 MED ORDER — TRAZODONE HCL 50 MG PO TABS
50.0000 mg | ORAL_TABLET | Freq: Every evening | ORAL | Status: DC | PRN
  Administered 2024-07-09 – 2024-07-19 (×2): 50 mg via ORAL
  Filled 2024-07-09 (×2): qty 1

## 2024-07-09 MED ORDER — HYDROXYZINE HCL 25 MG PO TABS
25.0000 mg | ORAL_TABLET | Freq: Four times a day (QID) | ORAL | Status: DC | PRN
  Administered 2024-07-09: 25 mg via ORAL
  Filled 2024-07-09: qty 1

## 2024-07-09 MED ORDER — OLANZAPINE 10 MG IM SOLR: 5.0000 mg | Freq: Three times a day (TID) | INTRAMUSCULAR | Status: DC | PRN

## 2024-07-09 MED ORDER — ACETAMINOPHEN 325 MG PO TABS
650.0000 mg | ORAL_TABLET | Freq: Four times a day (QID) | ORAL | Status: DC | PRN
  Administered 2024-07-10: 650 mg via ORAL
  Filled 2024-07-09: qty 2

## 2024-07-09 MED ORDER — ACETAMINOPHEN 325 MG PO TABS: 650.0000 mg | ORAL_TABLET | Freq: Four times a day (QID) | ORAL | Status: DC | PRN

## 2024-07-09 MED ORDER — OLANZAPINE 5 MG PO TBDP: 5.0000 mg | ORAL_TABLET | Freq: Three times a day (TID) | ORAL | Status: DC | PRN

## 2024-07-09 MED ORDER — DIPHENHYDRAMINE HCL 25 MG PO CAPS: 50.0000 mg | ORAL_CAPSULE | Freq: Three times a day (TID) | ORAL | Status: DC | PRN

## 2024-07-09 MED ORDER — NICOTINE 21 MG/24HR TD PT24: 21.0000 mg | MEDICATED_PATCH | Freq: Every day | TRANSDERMAL | Status: DC

## 2024-07-09 MED ORDER — TRAZODONE HCL 50 MG PO TABS: 50.0000 mg | ORAL_TABLET | Freq: Every evening | ORAL | Status: DC | PRN

## 2024-07-09 MED ORDER — DIPHENHYDRAMINE HCL 50 MG PO CAPS: 50.0000 mg | ORAL_CAPSULE | Freq: Three times a day (TID) | ORAL | Status: DC | PRN

## 2024-07-09 MED ORDER — HYDROXYZINE HCL 25 MG PO TABS
25.0000 mg | ORAL_TABLET | Freq: Four times a day (QID) | ORAL | Status: DC | PRN
  Administered 2024-07-09 – 2024-07-20 (×14): 25 mg via ORAL
  Filled 2024-07-09 (×14): qty 1

## 2024-07-09 MED ORDER — NICOTINE 21 MG/24HR TD PT24
21.0000 mg | MEDICATED_PATCH | Freq: Every day | TRANSDERMAL | Status: DC
  Administered 2024-07-10: 21 mg via TRANSDERMAL
  Filled 2024-07-09: qty 1
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
## 2024-07-09 NOTE — Group Note (Signed)
 Date:  07/09/2024 Time:  10:41 PM  Group Topic/Focus:  Wrap-Up Group:   The focus of this group is to help patients review their daily goal of treatment and discuss progress on daily workbooks.    Participation Level:  Did Not Attend  Participation Quality:     Affect:     Cognitive:     Insight: None  Engagement in Group:  None  Modes of Intervention:     Additional Comments:    Carla King 07/09/2024, 10:41 PM
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
## 2024-07-09 NOTE — Progress Notes (Signed)
 Pt is admitted to [facility] due to depression. Pt is alert and oriented X4 with flat affect. Pt is ambulatory and is oriented to staff/unit. Pt was cooperative with labs and skin assessment. Bruises were noted on pt's bilateral lower arms and she reported that they are due to dog scratches. Pt reported that her mind would not stop racing. PRN Hydroxyzine  administered per order. Pt denies pain and current SI/HI/AVH, plan or intent. Staff will monitor for pt's safety.

## 2024-07-09 NOTE — ED Notes (Signed)
 Patient was provided lunch

## 2024-07-09 NOTE — Progress Notes (Signed)
 Patient states she feels hopeless and depressed but does not want to commit suicide.

## 2024-07-09 NOTE — Progress Notes (Signed)
   07/09/24 2215  Psych Admission Type (Psych Patients Only)  Admission Status Voluntary  Psychosocial Assessment  Patient Complaints Depression  Eye Contact Brief  Facial Expression Flat  Affect Flat  Speech Logical/coherent  Interaction Assertive  Motor Activity Fidgety;Restless  Appearance/Hygiene In scrubs  Behavior Characteristics Cooperative;Appropriate to situation  Mood Depressed  Aggressive Behavior  Targets Self  Type of Behavior Other (Comment) (Sad, Depressed)  Effect No apparent injury  Thought Process  Coherency WDL  Content WDL  Delusions None reported or observed  Perception WDL  Hallucination None reported or observed  Judgment Poor  Confusion None  Danger to Self  Current suicidal ideation? Denies  Danger to Others  Danger to Others None reported or observed   Patient admitted to unit @ 2215. Patient c/o not sleeping and not eating for several days. Patient states that she is depressed and is not sure why. She also stated that it would be OK if she went to sleep and did not wake up but she does not want to actively hurt herself.
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
## 2024-07-09 NOTE — Progress Notes (Signed)
 Pt was accepted to [facility] on 07/09/2024  Bed Assignment: 30   Address: [address] [place], [place], [place] [address]  -[facility] Fax: [phone]  Pt meets inpatient criteria per Clovia Mccoy NP   Attending Physician will be Dr. Allyn Donnelly COME  Report can be called to: [phone]  Pt can arrive pending labs   Care Team notified: [facility] Cherylynn Ernst, RN, Dorla Jung, RN, Clovia Mccoy NP

## 2024-07-10 DIAGNOSIS — F331 Major depressive disorder, recurrent, moderate: Secondary | ICD-10-CM

## 2024-07-10 LAB — PROLACTIN: Prolactin: 23.6 ng/mL (ref 3.6–25.2)

## 2024-07-10 MED ORDER — ZOLPIDEM TARTRATE 5 MG PO TABS
5.0000 mg | ORAL_TABLET | Freq: Every day | ORAL | Status: DC
  Administered 2024-07-10: 5 mg via ORAL
  Filled 2024-07-10: qty 1

## 2024-07-10 MED ORDER — NICOTINE 21 MG/24HR TD PT24
21.0000 mg | MEDICATED_PATCH | Freq: Every day | TRANSDERMAL | Status: DC
  Filled 2024-07-10 (×2): qty 1

## 2024-07-10 MED ORDER — DIVALPROEX SODIUM ER 500 MG PO TB24
500.0000 mg | ORAL_TABLET | Freq: Every day | ORAL | Status: DC
  Administered 2024-07-10 – 2024-07-16 (×7): 500 mg via ORAL
  Filled 2024-07-10 (×7): qty 1

## 2024-07-10 MED ORDER — ADULT MULTIVITAMIN W/MINERALS CH
1.0000 | ORAL_TABLET | Freq: Every day | ORAL | Status: DC
  Administered 2024-07-11 – 2024-07-20 (×10): 1 via ORAL
  Filled 2024-07-10 (×9): qty 1

## 2024-07-10 MED ORDER — ENSURE PLUS HIGH PROTEIN PO LIQD
237.0000 mL | Freq: Three times a day (TID) | ORAL | Status: DC
  Administered 2024-07-11 – 2024-07-17 (×7): 237 mL via ORAL

## 2024-07-10 MED ORDER — VENLAFAXINE HCL ER 37.5 MG PO CP24
37.5000 mg | ORAL_CAPSULE | Freq: Every day | ORAL | Status: DC
  Administered 2024-07-10 – 2024-07-12 (×3): 37.5 mg via ORAL
  Filled 2024-07-10 (×3): qty 1

## 2024-07-10 NOTE — Group Note (Signed)
 Date:  07/10/2024 Time:  9:06 PM  Group Topic/Focus:  Movie Group The purpose of this group is for patients to come together and watch a soothing movie of their liking while having snack with their peers    Participation Level:  Did Not Attend  Beatris ONEIDA Hasten 07/10/2024, 9:06 PM

## 2024-07-10 NOTE — Group Note (Deleted)
 Recreation Therapy Group Note   Group Topic:Coping Skills  Group Date: 07/10/2024 Start Time: 1100 End Time: 1130 Facilitators: Celestia Jeoffrey BRAVO, LRT Location: Dayroom  Group Description: Music. Patients are encouraged to name their favorite song(s) for LRT to play song through speaker for group to hear, while in the courtyard getting fresh air and sunlight. Patients educated on the definition of leisure and the importance of having different leisure interests outside of the hospital. Group discussed how leisure activities can often be used as pharmacologist and that listening to music and being outside are examples.    Goal Area(s) Addressed:  Patient will identify a current leisure interest.  Patient will practice making a positive decision. Patient will have the opportunity to try a new leisure activity.       Affect/Mood: {RT BHH Affect/Mood:26271}   Participation Level: {RT BHH Participation Level:26267}   Participation Quality: {RT BHH Participation Quality:26268}   Behavior: {RT BHH Group Behavior:26269}   Speech/Thought Process: {RT BHH Speech/Thought:26276}   Insight: {RT BHH Insight:26272}   Judgement: {RT BHH Judgement:26278}   Modes of Intervention: {RT BHH Modes of Intervention:26277}   Patient Response to Interventions:  {RT BHH Patient Response to Intervention:26274}   Education Outcome:  {RT BHH Education Outcome:26279}   Clinical Observations/Individualized Feedback: *** was *** in their participation of session activities and group discussion. Pt identified ***   Plan: {RT BHH Tx Eojw:73719}   Jeoffrey BRAVO Celestia, LRT,  07/10/2024 1:28 PM

## 2024-07-10 NOTE — Group Note (Signed)
 Recreation Therapy Group Note   Group Topic:Coping Skills  Group Date: 07/10/2024 Start Time: 1100 End Time: 1130 Facilitators: Celestia Jeoffrey BRAVO, LRT, CTRS Location: Dayroom  Group Description: Music. Patients are encouraged to name their favorite song(s) for LRT to play song through speaker for group to hear. Patients educated on the definition of leisure and the importance of having different leisure interests outside of the hospital. Group discussed how leisure activities can often be used as pharmacologist and that listening to music and being outside are examples.    Goal Area(s) Addressed:  Patient will identify a current leisure interest.  Patient will practice making a positive decision. Patient will have the opportunity to try a new leisure activity.   Affect/Mood: N/A   Participation Level: Did not attend    Clinical Observations/Individualized Feedback: Patient did not attend group.   Plan: Continue to engage patient in RT group sessions 2-3x/week.   Jeoffrey BRAVO Celestia, LRT, CTRS 07/10/2024 1:30 PM
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
## 2024-07-10 NOTE — Group Note (Signed)
 Date:  07/10/2024 Time:  10:25 AM  Group Topic/Focus:  Emotional Education:   The focus of this group is to discuss what feelings/emotions are, and how they are experienced.  Emotional regulation is the ability to manage your emotions, not suppress them, by recognizing, understanding, and responding to them in healthy ways. It involves influencing which emotions you have, when you have them, and how you experience and express them to achieve a desired outcome, rather than simply reacting impulsively. For example, taking a deep breath when frustrated instead of throwing things is a form of emotional regulation.   Participation Level:  Active  Participation Quality:  Appropriate  Affect:  Appropriate  Cognitive:  Appropriate  Insight: Appropriate  Engagement in Group:  Engaged  Modes of Intervention:  Activity and Discussion  Additional Comments:  N/A  Carla King 07/10/2024, 10:25 AM

## 2024-07-10 NOTE — Plan of Care (Signed)
  Problem: Education: Goal: Utilization of techniques to improve thought processes will improve Outcome: Progressing   Problem: Coping: Goal: Will verbalize feelings Outcome: Progressing
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
## 2024-07-10 NOTE — Progress Notes (Signed)
   07/10/24 1100  Psych Admission Type (Psych Patients Only)  Admission Status Voluntary  Psychosocial Assessment  Patient Complaints Depression  Eye Contact Brief  Facial Expression Flat  Affect Flat  Speech Logical/coherent  Interaction Assertive  Motor Activity Restless  Appearance/Hygiene Unremarkable  Behavior Characteristics Anxious;Pacing  Mood Anxious  Thought Process  Coherency WDL  Content WDL  Delusions None reported or observed  Perception WDL  Hallucination None reported or observed  Judgment Poor  Confusion None  Danger to Self  Current suicidal ideation? Denies  Danger to Others  Danger to Others None reported or observed

## 2024-07-10 NOTE — Plan of Care (Signed)
  Problem: Coping: Goal: Will verbalize feelings Outcome: Progressing   Problem: Activity: Goal: Imbalance in normal sleep/wake cycle will improve Outcome: Not Progressing

## 2024-07-10 NOTE — Group Note (Signed)
 Recreation Therapy Group Note   Group Topic:Emotion Expression  Group Date: 07/10/2024 Start Time: 1500 End Time: 1600 Facilitators: Celestia Jeoffrey BRAVO, LRT, CTRS Location: Dayroom  Group Description: Painting a Diplomatic Services Operational Officer. Patients and LRT discuss what it means to be "at peace", what it feels like physically and mentally. Pts are given a canvas and watercolor paint to use and encouraged to draw their idea of a peaceful place. Pts and LRT discuss how they use this in their daily life post discharge. Pts are encouraged to take their canvas home with them as a reminder to find their peaceful place whenever they are feeling depressed, anxious, etc.    Goal Area(s) Addressed:  Patient will identify what it means to experience a "peaceful" emotion. Patient will identify a new coping skill.  Patient will express their emotions through art. Patients will increase communication by talking with LRT and peers while in group.  Affect/Mood: N/A   Participation Level: Did not attend    Clinical Observations/Individualized Feedback: Patient did not attend group.   Plan: Continue to engage patient in RT group sessions 2-3x/week.   Jeoffrey BRAVO Celestia, LRT, CTRS 07/10/2024 4:41 PM

## 2024-07-10 NOTE — Group Note (Signed)
 LCSW Group Therapy Note  Group Date: 07/10/2024 Start Time: 1315 End Time: 1400   Type of Therapy and Topic:  Group Therapy - Healthy vs Unhealthy Coping Skills  Participation Level:  Did Not Attend   Description of Group The focus of this group was to determine what unhealthy coping techniques typically are used by group members and what healthy coping techniques would be helpful in coping with various problems. Patients were guided in becoming aware of the differences between healthy and unhealthy coping techniques. Patients were asked to identify 2-3 healthy coping skills they would like to learn to use more effectively.  Therapeutic Goals Patients learned that coping is what human beings do all day long to deal with various situations in their lives Patients defined and discussed healthy vs unhealthy coping techniques Patients identified their preferred coping techniques and identified whether these were healthy or unhealthy Patients determined 2-3 healthy coping skills they would like to become more familiar with and use more often. Patients provided support and ideas to each other   Summary of Patient Progress:  X   Therapeutic Modalities Cognitive Behavioral Therapy Motivational Interviewing  Carla King, [place] 07/10/2024  2:20 PM

## 2024-07-10 NOTE — Progress Notes (Signed)
 NUTRITION ASSESSMENT  Pt identified as at risk on the Malnutrition Screen Tool  INTERVENTION:  -Once diet is ordered:   -Ensure Plus High Protein po TID, each supplement provides 350 kcal and 20 grams of protein  -MVI with minerals daily  NUTRITION DIAGNOSIS: Unintentional weight loss related to sub-optimal intake as evidenced by pt report.   Goal: Pt to meet >/= 90% of their estimated nutrition needs.  Monitor:  PO intake  Assessment:   Patient admitted with MSS and suicidal ideations.   Per nursing notes, patient with poor appetite and unable to sleep for several days PTA.   Reviewed weight history; patient has experienced a 18.7% weight loss over the past 6 months, which is significant for time frame. Highly suspect patient with malnutrition given significant weight loss and reported poor appetite, however, unable to identify at this time. Patient would greatly benefit from addition of oral nutrition supplements.   Medications reviewed.   Labs reviewed. Tox screen positive for marijuana.    58 y.o. female  Height: Ht Readings from Last 1 Encounters:  07/09/24 5' 5 (1.651 m)    Weight: Wt Readings from Last 1 Encounters:  07/09/24 55.3 kg    Weight Hx: Wt Readings from Last 10 Encounters:  07/09/24 55.3 kg  02/03/24 67.1 kg  01/05/24 68 kg  01/01/24 68 kg  02/23/13 57.4 kg  02/18/12 58.5 kg    BMI:  Body mass index is 20.3 kg/m. BMI WDL.   Estimated Nutritional Needs: Kcal: 25-30 kcal/kg Protein: > 1 gram protein/kg Fluid: 1 ml/kcal  Diet Order:  Diet Order     None      Pt is also offered choice of unit snacks mid-morning and mid-afternoon.  Pt is eating as desired.   Lab results and medications reviewed.   Margery ORN, RD, LDN, CDCES Registered Dietitian III Certified Diabetes Care and Education Specialist If unable to reach this RD, please use RD Inpatient group chat on secure chat between hours of 8am-4 pm daily

## 2024-07-11 DIAGNOSIS — F331 Major depressive disorder, recurrent, moderate: Secondary | ICD-10-CM | POA: Diagnosis not present

## 2024-07-11 MED ORDER — ZOLPIDEM TARTRATE 5 MG PO TABS
10.0000 mg | ORAL_TABLET | Freq: Every day | ORAL | Status: DC
  Administered 2024-07-11 – 2024-07-19 (×9): 10 mg via ORAL
  Filled 2024-07-11 (×9): qty 2

## 2024-07-11 NOTE — [facility] Suicide Risk Assessment (Signed)
 [facility] INPATIENT:  Family/Significant Other Suicide Prevention Education  Suicide Prevention Education:  Patient Refusal for Family/Significant Other Suicide Prevention Education: The patient Carla King has refused to provide written consent for family/significant other to be provided Family/Significant Other Suicide Prevention Education during admission and/or prior to discharge.  Physician notified.  Carla King 07/11/2024, 1:39 PM

## 2024-07-11 NOTE — Progress Notes (Signed)
 [facility] MD Progress Note  07/11/2024 3:33 PM Carla King  MRN:  [id number]  Authier is a 58 year old female presenting to [facility] accompanied by her father. Pt states that she is very depressed at this time. Pt reports that she does not want to get out of bed and has been going on for 3 months. Pt reports she has not been sleeping for weeks. Pt states, I dont have SI thoughts but if I did not wake up that would be fine. 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 Patient is admitted to [facility] unit with Q15 min safety monitoring. Multidisciplinary team approach is offered. Medication management; group/milieu therapy is offered.  Subjective:  Chart reviewed, case discussed in multidisciplinary meeting, patient seen during rounds.   Patient is noted to be resting in bed.  She informed the provider that yesterday after taking her medication she felt a lot better, she felt almost her depression going away, took shower and felt very happy and energetic and ready to go home.  She reports that with Ambien  in the night she slept off but woke up around 4 AM and felt tired and depressed again.  Provider discussed the possibility of primary insomnia with the patient as she reportedly tried trazodone , doxepin, Seroquel, hydroxyzine  with no benefit.  Provider encouraged patient to get sleep study done and probably try CBT insomnia along with medications.  Patient denies SI/HI/plan and denies auditory/visual hallucinations.  She did agree to try Ambien  10 mg nightly Sleep: Poor  Appetite:  Fair  Past Psychiatric History: see h&P Family History: History reviewed. No pertinent family history. Social History:  Social History   Substance and Sexual Activity  Alcohol Use No     Social History   Substance and Sexual Activity  Drug Use Yes   Types: Marijuana    Social History   Socioeconomic History   Marital status: Married    Spouse name: Not on  file   Number of children: Not on file   Years of education: Not on file   Highest education level: Not on file  Occupational History   Not on file  Tobacco Use   Smoking status: Never   Smokeless tobacco: Never  Vaping Use   Vaping status: Every Day  Substance and Sexual Activity   Alcohol use: No   Drug use: Yes    Types: Marijuana   Sexual activity: Yes  Other Topics Concern   Not on file  Social History Narrative   Not on file   Social Drivers of Health   Financial Resource Strain: Low Risk  (09/16/2023)   Received from [facility]   Overall Financial Resource Strain (CARDIA)    Difficulty of Paying Living Expenses: Not hard at all  Food Insecurity: No Food Insecurity (07/09/2024)   Hunger Vital Sign    Worried About Running Out of Food in the Last Year: Never true    Ran Out of Food in the Last Year: Never true  Transportation Needs: No Transportation Needs (07/09/2024)   PRAPARE - Administrator, Civil Service (Medical): No    Lack of Transportation (Non-Medical): No  Physical Activity: Not on file  Stress: Not on file  Social Connections: Socially Isolated (07/09/2024)   Social Connection and Isolation Panel    Frequency of Communication with Friends and Family: More than three times a week    Frequency  of Social Gatherings with Friends and Family: Once a week    Attends Religious Services: Never    Database Administrator or Organizations: No    Attends Engineer, Structural: Never    Marital Status: Divorced   Past Medical History:  Past Medical History:  Diagnosis Date   alcohol    colon ca 2011    Past Surgical History:  Procedure Laterality Date   AUGMENTATION MAMMAPLASTY Bilateral    LAPAROSCOPIC PARTIAL COLECTOMY Left Feb 2004    Current Medications: Current Facility-Administered Medications  Medication Dose Route Frequency Provider Last Rate Last Admin   acetaminophen  (TYLENOL ) tablet 650 mg  650 mg Oral Q6H  PRN Olasunkanmi, Oluwatosin, NP   650 mg at 07/10/24 2247   diphenhydrAMINE  (BENADRYL ) capsule 50 mg  50 mg Oral Q8H PRN Olasunkanmi, Oluwatosin, NP       divalproex (DEPAKOTE ER) 24 hr tablet 500 mg  500 mg Oral Daily Laine Giovanetti, MD   500 mg at 07/11/24 1000   feeding supplement (ENSURE PLUS HIGH PROTEIN) liquid 237 mL  237 mL Oral TID BM Bernell Haynie, MD   237 mL at 07/11/24 1409   hydrOXYzine  (ATARAX ) tablet 25 mg  25 mg Oral Q6H PRN Olasunkanmi, Oluwatosin, NP   25 mg at 07/11/24 1333   multivitamin with minerals tablet 1 tablet  1 tablet Oral Daily Rollin Kotowski, MD   1 tablet at 07/11/24 1000   nicotine  (NICODERM CQ  - dosed in mg/24 hours) patch 21 mg  21 mg Transdermal Q0600 Shakiera Edelson, MD       OLANZapine  (ZYPREXA ) injection 5 mg  5 mg Intramuscular TID PRN Olasunkanmi, Oluwatosin, NP       OLANZapine  zydis (ZYPREXA ) disintegrating tablet 5 mg  5 mg Oral TID PRN Olasunkanmi, Oluwatosin, NP       traZODone  (DESYREL ) tablet 50 mg  50 mg Oral QHS PRN Olasunkanmi, Oluwatosin, NP   50 mg at 07/09/24 2235   venlafaxine XR (EFFEXOR-XR) 24 hr capsule 37.5 mg  37.5 mg Oral Q breakfast Emberley Kral, MD   37.5 mg at 07/11/24 1004   zolpidem  (AMBIEN ) tablet 10 mg  10 mg Oral QHS Tiearra Colwell, MD        Lab Results:  Results for orders placed or performed during the hospital encounter of 07/09/24 (from the past 48 hours)  Resp panel by RT-PCR (RSV, Flu A&B, Covid) Anterior Nasal Swab     Status: None   Collection Time: 07/09/24  5:00 PM   Specimen: Anterior Nasal Swab  Result Value Ref Range   SARS Coronavirus 2 by RT PCR NEGATIVE NEGATIVE   Influenza A by PCR NEGATIVE NEGATIVE   Influenza B by PCR NEGATIVE NEGATIVE    Comment: (NOTE) The Xpert Xpress SARS-CoV-2/FLU/RSV plus assay is intended as an aid in the diagnosis of influenza from Nasopharyngeal swab specimens and should not be used as a sole basis for treatment. Nasal washings and aspirates are unacceptable for Xpert  Xpress SARS-CoV-2/FLU/RSV testing.  Fact Sheet for Patients: bloggercourse.com  Fact Sheet for Healthcare Providers: seriousbroker.it  This test is not yet approved or cleared by the [place]  FDA and has been authorized for detection and/or diagnosis of SARS-CoV-2 by FDA under an Emergency Use Authorization (EUA). This EUA will remain in effect (meaning this test can be used) for the duration of the COVID-19 declaration under Section 564(b)(1) of the Act, 21 U.S.C. section 360bbb-3(b)(1), unless the authorization is terminated or revoked.  Resp Syncytial Virus by PCR NEGATIVE NEGATIVE    Comment: (NOTE) Fact Sheet for Patients: bloggercourse.com  Fact Sheet for Healthcare Providers: seriousbroker.it  This test is not yet approved or cleared by the [place]  FDA and has been authorized for detection and/or diagnosis of SARS-CoV-2 by FDA under an Emergency Use Authorization (EUA). This EUA will remain in effect (meaning this test can be used) for the duration of the COVID-19 declaration under Section 564(b)(1) of the Act, 21 U.S.C. section 360bbb-3(b)(1), unless the authorization is terminated or revoked.  Performed at [facility], [address], [place], [place] [address]     Blood Alcohol level:  Lab Results  Component Value Date   [facility] <15 07/09/2024   ETH <15 02/03/2024    Metabolic Disorder Labs: No results found for: HGBA1C, MPG Lab Results  Component Value Date   PROLACTIN 23.6 07/09/2024   Lab Results  Component Value Date   CHOL 169 02/23/2013   TRIG 60.0 02/23/2013   HDL 68.30 02/23/2013   CHOLHDL 2 02/23/2013   VLDL 12.0 02/23/2013   LDLCALC 89 02/23/2013   LDLCALC 101 (H) 02/18/2012    Physical Findings: AIMS:  , ,  ,  ,    CIWA:    COWS:      Psychiatric Specialty Exam:  Presentation  General Appearance:   Appropriate for Environment; Casual  Eye Contact: Fair  Speech: Normal Rate  Speech Volume: Decreased    Mood and Affect  Mood: Anxious; Depressed; Dysphoric  Affect: Depressed; Flat   Thought Process  Thought Processes: Coherent  Orientation:Full (Time, Place and Person)  Thought Content:Illogical  Hallucinations:Hallucinations: None  Ideas of Reference:None  Suicidal Thoughts:Suicidal Thoughts: No  Homicidal Thoughts:Homicidal Thoughts: No   Sensorium  Memory: Immediate Fair; Remote Fair; Recent Fair  Judgment: Impaired  Insight: Shallow   Executive Functions  Concentration: Fair  Attention Span: Fair  Recall: Fair  Fund of Knowledge: Fair  Language: Fair   Psychomotor Activity  Psychomotor Activity: Psychomotor Activity: Normal  Musculoskeletal: Strength & Muscle Tone: within normal limits Gait & Station: normal Assets  Assets: Manufacturing Systems Engineer; Desire for Improvement; Social Support    Physical Exam: Physical Exam ROS Blood pressure 97/72, pulse 78, temperature 97.8 F (36.6 C), resp. rate 16, height 5' 5 (1.651 m), weight 55.3 kg, last menstrual period 01/28/2015, SpO2 96%. Body mass index is 20.3 kg/m.  Diagnosis: Principal Problem:   Major depressive disorder, recurrent episode, moderate degree (HCC)   Clinical Decision Making: Patient currently admitted for worsening depression, hopelessness and worsening anxiety, denies SI/HI/plan, denies hallucinations.  Patient currently is not on any psychotropic medications and is requesting stabilization.  Patient has been tried on multiple psychotropic medications with minimal response.  Treatment Plan Summary:  Safety and Monitoring:             -- Voluntary admission to inpatient psychiatric unit for safety, stabilization and treatment             -- Daily contact with patient to assess and evaluate symptoms and progress in treatment             -- Patient's case  to be discussed in multi-disciplinary team meeting             -- Observation Level: q15 minute checks             -- Vital signs:  q12 hours             -- Precautions:  suicide, elopement, and assault   2. Psychiatric Diagnoses and Treatment:               Will try the combination of Effexor XR 37.5 mg daily and Depakote ER 500 mg Ambien  increased to 10 mg nightly will be added for insomnia   -- The risks/benefits/side-effects/alternatives to this medication were discussed in detail with the patient and time was given for questions. The patient consents to medication trial.                -- Metabolic profile and EKG monitoring obtained while on an atypical antipsychotic (BMI: Lipid Panel: HbgA1c: QTc:)              -- Encouraged patient to participate in unit milieu and in scheduled group therapies                            3. Medical Issues Being Addressed:  No urgent medical needs identified at this time  4. Discharge Planning:   -- Social work and case management to assist with discharge planning and identification of hospital follow-up needs prior to discharge  -- Estimated LOS: 3-4 days  Allyn Foil, MD 07/11/2024, 3:33 PM

## 2024-07-11 NOTE — Plan of Care (Signed)
   Problem: Coping: Goal: Coping ability will improve Outcome: Progressing

## 2024-07-11 NOTE — [facility] Counselor (Signed)
 CSW placed referral to financial counseling for Medicaid application

## 2024-07-11 NOTE — Group Note (Signed)
 Date:  07/11/2024 Time:  10:10 AM  Group Topic/Focus:  Community Meeting    Participation Level:  Did Not Attend    [name] Bias 07/11/2024, 10:10 AM
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
## 2024-07-11 NOTE — Plan of Care (Signed)
  Problem: Education: Goal: Utilization of techniques to improve thought processes will improve Outcome: Progressing   Problem: Activity: Goal: Interest or engagement in leisure activities will improve Outcome: Progressing Goal: Imbalance in normal sleep/wake cycle will improve Outcome: Progressing   

## 2024-07-11 NOTE — BH IP Treatment Plan (Signed)
 Interdisciplinary Treatment and Diagnostic Plan Update  07/11/2024 Time of Session: 10:18 AM  Carla King MRN: [id number]  Principal Diagnosis: Major depressive disorder, recurrent episode, moderate degree (HCC)  Secondary Diagnoses: Principal Problem:   Major depressive disorder, recurrent episode, moderate degree (HCC)   Current Medications:  Current Facility-Administered Medications  Medication Dose Route Frequency Provider Last Rate Last Admin   acetaminophen  (TYLENOL ) tablet 650 mg  650 mg Oral Q6H PRN Olasunkanmi, Oluwatosin, NP   650 mg at 07/10/24 2247   diphenhydrAMINE  (BENADRYL ) capsule 50 mg  50 mg Oral Q8H PRN Olasunkanmi, Oluwatosin, NP       divalproex (DEPAKOTE ER) 24 hr tablet 500 mg  500 mg Oral Daily Jadapalle, Sree, MD   500 mg at 07/11/24 1000   feeding supplement (ENSURE PLUS HIGH PROTEIN) liquid 237 mL  237 mL Oral TID BM Jadapalle, Sree, MD   237 mL at 07/11/24 1000   hydrOXYzine  (ATARAX ) tablet 25 mg  25 mg Oral Q6H PRN Olasunkanmi, Oluwatosin, NP   25 mg at 07/11/24 0251   multivitamin with minerals tablet 1 tablet  1 tablet Oral Daily Jadapalle, Sree, MD   1 tablet at 07/11/24 1000   nicotine  (NICODERM CQ  - dosed in mg/24 hours) patch 21 mg  21 mg Transdermal Q0600 Jadapalle, Sree, MD       OLANZapine  (ZYPREXA ) injection 5 mg  5 mg Intramuscular TID PRN Olasunkanmi, Oluwatosin, NP       OLANZapine  zydis (ZYPREXA ) disintegrating tablet 5 mg  5 mg Oral TID PRN Olasunkanmi, Oluwatosin, NP       traZODone  (DESYREL ) tablet 50 mg  50 mg Oral QHS PRN Olasunkanmi, Oluwatosin, NP   50 mg at 07/09/24 2235   venlafaxine XR (EFFEXOR-XR) 24 hr capsule 37.5 mg  37.5 mg Oral Q breakfast Jadapalle, Sree, MD   37.5 mg at 07/11/24 1004   zolpidem  (AMBIEN ) tablet 5 mg  5 mg Oral QHS Jadapalle, Sree, MD   5 mg at 07/10/24 2107   PTA Medications: Medications Prior to Admission  Medication Sig Dispense Refill Last Dose/Taking   amitriptyline  (ELAVIL ) 25 MG tablet Take 25 mg by  mouth See admin instructions. Take 1 tablet PO every morning and 2 tablets PO at bedtime. (Patient not taking: Reported on 07/09/2024)      Cholecalciferol (VITAMIN D3 PO) Take 1 tablet by mouth daily.      clonazePAM  (KLONOPIN ) 0.5 MG tablet Take 1 tablet (0.5 mg total) by mouth 3 (three) times daily as needed (severe anxiety). (Patient not taking: Reported on 07/09/2024) 30 tablet 0    clonazePAM  (KLONOPIN ) 0.5 MG tablet Take 0.5 mg by mouth 2 (two) times daily as needed for anxiety. (Patient not taking: Reported on 07/09/2024)      cyclobenzaprine  (FLEXERIL ) 10 MG tablet Take 10 mg by mouth 3 (three) times daily as needed. (Patient not taking: Reported on 07/09/2024)      dicyclomine  (BENTYL ) 20 MG tablet Take 1 tablet (20 mg total) by mouth 4 (four) times daily -  before meals and at bedtime. (Patient not taking: Reported on 07/09/2024) 120 tablet 0    lithium  carbonate (ESKALITH ) 450 MG ER tablet Take 1 tablet (450 mg total) by mouth every 12 (twelve) hours. (Patient not taking: Reported on 07/09/2024) 60 tablet 0    MAGNESIUM  PO Take 1 tablet by mouth daily.      Multiple Vitamin (MULTIVITAMIN) tablet Take 1 tablet by mouth daily.      paliperidone  (INVEGA ) 6 MG 24  hr tablet Take 1 tablet (6 mg total) by mouth daily. (Patient not taking: Reported on 07/09/2024) 30 tablet 0    PARoxetine  (PAXIL ) 20 MG tablet Take 20 mg by mouth 2 (two) times daily. (Patient not taking: Reported on 07/09/2024)      thiamine  (VITAMIN B-1) 100 MG tablet Take 1 tablet (100 mg total) by mouth daily. (Patient not taking: Reported on 07/09/2024) 30 tablet 0    Venlafaxine HCl (EFFEXOR XR PO) Take 1 tablet by mouth See admin instructions. Unknown strength and frequency (Patient not taking: Reported on 07/09/2024)      ziprasidone  (GEODON ) 20 MG capsule Take 20 mg by mouth 2 (two) times daily with a meal. (Patient not taking: Reported on 07/09/2024)       Patient Stressors:    Patient Strengths:    Treatment  Modalities: Medication Management, Group therapy, Case management,  1 to 1 session with clinician, Psychoeducation, Recreational therapy.   Physician Treatment Plan for Primary Diagnosis: Major depressive disorder, recurrent episode, moderate degree (HCC) Long Term Goal(s): Improvement in symptoms so as ready for discharge   Short Term Goals: Ability to identify changes in lifestyle to reduce recurrence of condition will improve Ability to verbalize feelings will improve Ability to disclose and discuss suicidal ideas Ability to demonstrate self-control will improve Ability to identify and develop effective coping behaviors will improve Ability to maintain clinical measurements within normal limits will improve  Medication Management: Evaluate patient's response, side effects, and tolerance of medication regimen.  Therapeutic Interventions: 1 to 1 sessions, Unit Group sessions and Medication administration.  Evaluation of Outcomes: Progressing  Physician Treatment Plan for Secondary Diagnosis: Principal Problem:   Major depressive disorder, recurrent episode, moderate degree (HCC)  Long Term Goal(s): Improvement in symptoms so as ready for discharge   Short Term Goals: Ability to identify changes in lifestyle to reduce recurrence of condition will improve Ability to verbalize feelings will improve Ability to disclose and discuss suicidal ideas Ability to demonstrate self-control will improve Ability to identify and develop effective coping behaviors will improve Ability to maintain clinical measurements within normal limits will improve     Medication Management: Evaluate patient's response, side effects, and tolerance of medication regimen.  Therapeutic Interventions: 1 to 1 sessions, Unit Group sessions and Medication administration.  Evaluation of Outcomes: Progressing   RN Treatment Plan for Primary Diagnosis: Major depressive disorder, recurrent episode, moderate degree  (HCC) Long Term Goal(s): Knowledge of disease and therapeutic regimen to maintain health will improve  Short Term Goals: Ability to remain free from injury will improve, Ability to verbalize frustration and anger appropriately will improve, Ability to demonstrate self-control, Ability to participate in decision making will improve, Ability to verbalize feelings will improve, Ability to disclose and discuss suicidal ideas, Ability to identify and develop effective coping behaviors will improve, and Compliance with prescribed medications will improve  Medication Management: RN will administer medications as ordered by provider, will assess and evaluate patient's response and provide education to patient for prescribed medication. RN will report any adverse and/or side effects to prescribing provider.  Therapeutic Interventions: 1 on 1 counseling sessions, Psychoeducation, Medication administration, Evaluate responses to treatment, Monitor vital signs and CBGs as ordered, Perform/monitor CIWA, COWS, AIMS and Fall Risk screenings as ordered, Perform wound care treatments as ordered.  Evaluation of Outcomes: Progressing   LCSW Treatment Plan for Primary Diagnosis: Major depressive disorder, recurrent episode, moderate degree (HCC) Long Term Goal(s): Safe transition to appropriate next level of care at discharge,  Engage patient in therapeutic group addressing interpersonal concerns.  Short Term Goals: Engage patient in aftercare planning with referrals and resources, Increase social support, Increase ability to appropriately verbalize feelings, Increase emotional regulation, Facilitate acceptance of mental health diagnosis and concerns, Facilitate patient progression through stages of change regarding substance use diagnoses and concerns, Identify triggers associated with mental health/substance abuse issues, and Increase skills for wellness and recovery  Therapeutic Interventions: Assess for all  discharge needs, 1 to 1 time with Social worker, Explore available resources and support systems, Assess for adequacy in community support network, Educate family and significant other(s) on suicide prevention, Complete Psychosocial Assessment, Interpersonal group therapy.  Evaluation of Outcomes: Progressing   Progress in Treatment: Attending groups: Yes. and No. Participating in groups: Yes. and No. Taking medication as prescribed: Yes. Toleration medication: Yes. Family/Significant other contact made: No, will contact:  CSW will contact if given permission  Patient understands diagnosis: Yes. Discussing patient identified problems/goals with staff: Yes. Medical problems stabilized or resolved: Yes. Denies suicidal/homicidal ideation: Yes. Issues/concerns per patient self-inventory: No. Other: None   New problem(s) identified: No, Describe:  None identified   New Short Term/Long Term Goal(s): elimination of symptoms of psychosis, medication management for mood stabilization; elimination of SI thoughts; development of comprehensive mental wellness plan.   Patient Goals:  I've been trying to get some sleep, help me with my depress and anxiety medication  Discharge Plan or Barriers: CSW will assist with appropriate discharge planning   Reason for Continuation of Hospitalization: Anxiety Depression Medication stabilization  Estimated Length of Stay: 1 to 7 days   Last 3 [place] Suicide Severity Risk Score: Flowsheet Row Admission (Current) from 07/09/2024 in [facility] BEHAVIORAL MEDICINE Most recent reading at 07/09/2024 10:15 PM ED from 07/09/2024 in [facility] Most recent reading at 07/09/2024 11:45 AM Admission (Discharged) from 02/04/2024 in [facility] INPATIENT BEHAVIORAL MEDICINE Most recent reading at 02/04/2024  3:13 AM  C-SSRS RISK CATEGORY Low Risk No Risk No Risk    Last PHQ 2/9 Scores:     No data to display          Scribe for  Treatment Team: Lum JONETTA Croft, ISRAEL 07/11/2024 10:38 AM

## 2024-07-11 NOTE — Group Note (Signed)
 Date:  07/11/2024 Time:  9:02 PM  Group Topic/Focus:  Wrap-Up Group:   The focus of this group is to help patients review their daily goal of treatment and discuss progress on daily workbooks.    Participation Level:  Did Not Attend  Additional Comments:    Kristen VEAR Gibbon 07/11/2024, 9:02 PM

## 2024-07-11 NOTE — Progress Notes (Signed)
   07/11/24 2200  Psych Admission Type (Psych Patients Only)  Admission Status Voluntary  Psychosocial Assessment  Patient Complaints Depression  Eye Contact Brief  Facial Expression Flat  Affect Flat  Speech Soft  Interaction Isolative  Motor Activity Restless  Appearance/Hygiene Unremarkable  Behavior Characteristics Anxious  Mood Depressed;Anxious  Thought Process  Coherency WDL  Content WDL  Delusions None reported or observed  Perception WDL  Hallucination None reported or observed  Judgment Poor  Confusion None  Danger to Self  Current suicidal ideation? Denies  Danger to Others  Danger to Others None reported or observed

## 2024-07-11 NOTE — Group Note (Signed)
 Date:  07/11/2024 Time:  11:24 PM  Group Topic/Focus:  Self Care:   The focus of this group is to help patients understand the importance of self-care in order to improve or restore emotional, physical, spiritual, interpersonal, and financial health.    Participation Level:  Did Not Attend  Participation Quality:  Did Not Attend  Affect:  Did Not Attend  Cognitive:  Did Not Attend  Insight: None  Engagement in Group:  None  Modes of Intervention:  Did Not Attend  Additional Comments:    Laymon ONEIDA Finder 07/11/2024, 11:24 PM

## 2024-07-11 NOTE — Progress Notes (Signed)
 Patient reports feeling better after receiving Ambien , she reports her mood improved and was very talkative with staff. Patient called her dad and spoke with him, then she went to bed.

## 2024-07-11 NOTE — Progress Notes (Signed)
   07/11/24 1400  Psych Admission Type (Psych Patients Only)  Admission Status Voluntary  Psychosocial Assessment  Patient Complaints Depression  Eye Contact Brief  Facial Expression Flat  Affect Flat  Speech Logical/coherent  Interaction Assertive  Motor Activity Restless  Appearance/Hygiene Unremarkable  Behavior Characteristics Anxious  Mood Anxious  Thought Process  Coherency WDL  Content WDL  Delusions None reported or observed  Perception WDL  Hallucination None reported or observed  Judgment Poor  Confusion None  Danger to Self  Current suicidal ideation? Denies  Danger to Others  Danger to Others None reported or observed

## 2024-07-12 DIAGNOSIS — F331 Major depressive disorder, recurrent, moderate: Secondary | ICD-10-CM | POA: Diagnosis not present

## 2024-07-12 MED ORDER — VENLAFAXINE HCL ER 75 MG PO CP24
75.0000 mg | ORAL_CAPSULE | Freq: Every day | ORAL | Status: DC
  Administered 2024-07-13 – 2024-07-15 (×3): 75 mg via ORAL
  Filled 2024-07-12 (×3): qty 1

## 2024-07-12 NOTE — Group Note (Signed)
 Recreation Therapy Group Note   Group Topic:Coping Skills  Group Date: 07/12/2024 Start Time: 1500 End Time: 1535 Facilitators: Celestia Jeoffrey BRAVO, LRT, CTRS Location: Courtyard  Group Description: Music. Patients are encouraged to name their favorite song(s) for LRT to play song through speaker for group to hear, while in the courtyard getting fresh air and sunlight. Patients educated on the definition of leisure and the importance of having different leisure interests outside of the hospital. Group discussed how leisure activities can often be used as pharmacologist and that listening to music and being outside are examples.    Goal Area(s) Addressed:  Patient will identify a current leisure interest.  Patient will practice making a positive decision. Patient will have the opportunity to try a new leisure activity.   Affect/Mood: N/A   Participation Level: Did not attend    Clinical Observations/Individualized Feedback: Patient did not attend group.   Plan: Continue to engage patient in RT group sessions 2-3x/week.   Jeoffrey BRAVO Celestia, LRT, CTRS 07/12/2024 4:45 PM

## 2024-07-12 NOTE — Plan of Care (Signed)
  Problem: Coping: Goal: Coping ability will improve Outcome: Progressing Goal: Will verbalize feelings Outcome: Progressing   Problem: Activity: Goal: Interest or engagement in leisure activities will improve Outcome: Not Progressing

## 2024-07-12 NOTE — Plan of Care (Signed)
  Problem: Coping: Goal: Will verbalize feelings Outcome: Not Progressing   Problem: Activity: Goal: Interest or engagement in leisure activities will improve Outcome: Not Progressing   Problem: Coping: Goal: Will verbalize feelings Outcome: Not Progressing

## 2024-07-12 NOTE — Group Note (Signed)
 Recreation Therapy Group Note   Group Topic:Relaxation  Group Date: 07/12/2024 Start Time: 1105 End Time: 1130 Facilitators: Celestia Jeoffrey BRAVO, LRT, CTRS Location: Dayroom  Group Description: Meditation. LRT and patients discussed what they know about meditation and mindfulness. LRT played a Deep Breathing Meditation exercise script for patients to follow along to. LRT and patients discussed how meditation and deep breathing can be used as a coping skill post--discharge to help manage symptoms of stress.   Goal Area(s) Addressed: Patient will practice using relaxation technique. Patient will identify a new coping skill.  Patient will follow multistep directions to reduce anxiety and stress.  Affect/Mood: N/A   Participation Level: Did not attend    Clinical Observations/Individualized Feedback: Patient did not attend group.   Plan: Continue to engage patient in RT group sessions 2-3x/week.   Jeoffrey BRAVO Celestia, LRT, CTRS 07/12/2024 1:52 PM

## 2024-07-12 NOTE — Progress Notes (Signed)
 [facility] MD Progress Note  07/12/2024 8:18 PM TOMMY MINICHIELLO  MRN:  [id number]  Cunningham is a 58 year old female presenting to [facility] accompanied by her father. Pt states that she is very depressed at this time. Pt reports that she does not want to get out of bed and has been going on for 3 months. Pt reports she has not been sleeping for weeks. Pt states, I dont have SI thoughts but if I did not wake up that would be fine. 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 Patient is admitted to [facility] unit with Q15 min safety monitoring. Multidisciplinary team approach is offered. Medication management; group/milieu therapy is offered.  Subjective:  Chart reviewed, case discussed in multidisciplinary meeting, patient seen during rounds.   Patient is noted to be resting in bed.  She reports worsening depression and anxiety but is unable to talk about any stressors.  She reports that her anxiety is disabling and makes her stay in her room all day.  Patient was educated about medications taking 4 to 6 weeks to show good improvement and patient expressed her frustration that she cannot wait that long.  Patient was also educated about the need for CBT or DBT therapeutic interventions along with medication management.  Provider discussed that optimizing the dosages of the current medication.  She did report sleeping better with Ambien  10 mg.  She denies SI/HI/plan and denies auditory/visual hallucinations.  Provider also discussed about discharge planning and TMS on discharge.   Sleep: Poor  Appetite:  Fair  Past Psychiatric History: see h&P Family History: History reviewed. No pertinent family history. Social History:  Social History   Substance and Sexual Activity  Alcohol Use No     Social History   Substance and Sexual Activity  Drug Use Yes   Types: Marijuana    Social History   Socioeconomic History   Marital status: Married     Spouse name: Not on file   Number of children: Not on file   Years of education: Not on file   Highest education level: Not on file  Occupational History   Not on file  Tobacco Use   Smoking status: Never   Smokeless tobacco: Never  Vaping Use   Vaping status: Every Day  Substance and Sexual Activity   Alcohol use: No   Drug use: Yes    Types: Marijuana   Sexual activity: Yes  Other Topics Concern   Not on file  Social History Narrative   Not on file   Social Drivers of Health   Financial Resource Strain: Low Risk  (09/16/2023)   Received from [facility]   Overall Financial Resource Strain (CARDIA)    Difficulty of Paying Living Expenses: Not hard at all  Food Insecurity: No Food Insecurity (07/09/2024)   Hunger Vital Sign    Worried About Running Out of Food in the Last Year: Never true    Ran Out of Food in the Last Year: Never true  Transportation Needs: No Transportation Needs (07/09/2024)   PRAPARE - Administrator, Civil Service (Medical): No    Lack of Transportation (Non-Medical): No  Physical Activity: Not on file  Stress: Not on file  Social Connections: Socially Isolated (07/09/2024)   Social Connection and Isolation Panel    Frequency of Communication with Friends and Family: More than three times a week  Frequency of Social Gatherings with Friends and Family: Once a week    Attends Religious Services: Never    Database Administrator or Organizations: No    Attends Engineer, Structural: Never    Marital Status: Divorced   Past Medical History:  Past Medical History:  Diagnosis Date   alcohol    colon ca 2011    Past Surgical History:  Procedure Laterality Date   AUGMENTATION MAMMAPLASTY Bilateral    LAPAROSCOPIC PARTIAL COLECTOMY Left Feb 2004    Current Medications: Current Facility-Administered Medications  Medication Dose Route Frequency Provider Last Rate Last Admin   acetaminophen  (TYLENOL ) tablet 650  mg  650 mg Oral Q6H PRN Olasunkanmi, Oluwatosin, NP   650 mg at 07/10/24 2247   diphenhydrAMINE  (BENADRYL ) capsule 50 mg  50 mg Oral Q8H PRN Olasunkanmi, Oluwatosin, NP       divalproex (DEPAKOTE ER) 24 hr tablet 500 mg  500 mg Oral Daily Mills Mitton, MD   500 mg at 07/12/24 0934   feeding supplement (ENSURE PLUS HIGH PROTEIN) liquid 237 mL  237 mL Oral TID BM Flem Enderle, MD   237 mL at 07/11/24 1409   hydrOXYzine  (ATARAX ) tablet 25 mg  25 mg Oral Q6H PRN Olasunkanmi, Oluwatosin, NP   25 mg at 07/12/24 9271   multivitamin with minerals tablet 1 tablet  1 tablet Oral Daily Aimar Borghi, MD   1 tablet at 07/12/24 9065   nicotine  (NICODERM CQ  - dosed in mg/24 hours) patch 21 mg  21 mg Transdermal Q0600 Cable Fearn, MD       OLANZapine  (ZYPREXA ) injection 5 mg  5 mg Intramuscular TID PRN Olasunkanmi, Oluwatosin, NP       OLANZapine  zydis (ZYPREXA ) disintegrating tablet 5 mg  5 mg Oral TID PRN Olasunkanmi, Oluwatosin, NP       traZODone  (DESYREL ) tablet 50 mg  50 mg Oral QHS PRN Olasunkanmi, Oluwatosin, NP   50 mg at 07/09/24 2235   [START ON 07/13/2024] venlafaxine XR (EFFEXOR-XR) 24 hr capsule 75 mg  75 mg Oral Q breakfast Disa Riedlinger, MD       zolpidem  (AMBIEN ) tablet 10 mg  10 mg Oral QHS Nishita Isaacks, MD   10 mg at 07/11/24 2057    Lab Results:  No results found for this or any previous visit (from the past 48 hours).   Blood Alcohol level:  Lab Results  Component Value Date   [facility] <15 07/09/2024   ETH <15 02/03/2024    Metabolic Disorder Labs: No results found for: HGBA1C, MPG Lab Results  Component Value Date   PROLACTIN 23.6 07/09/2024   Lab Results  Component Value Date   CHOL 169 02/23/2013   TRIG 60.0 02/23/2013   HDL 68.30 02/23/2013   CHOLHDL 2 02/23/2013   VLDL 12.0 02/23/2013   LDLCALC 89 02/23/2013   LDLCALC 101 (H) 02/18/2012    Physical Findings: AIMS:  , ,  ,  ,    CIWA:    COWS:      Psychiatric Specialty Exam:  Presentation   General Appearance:  Appropriate for Environment; Casual  Eye Contact: Fair  Speech: Normal Rate  Speech Volume: Decreased    Mood and Affect  Mood: Anxious; Depressed; Dysphoric  Affect: Depressed; Flat   Thought Process  Thought Processes: Coherent  Orientation:Full (Time, Place and Person)  Thought Content:Illogical  Hallucinations: Denies  Ideas of Reference:None  Suicidal Thoughts: Denies  Homicidal Thoughts: Denies   Sensorium  Memory: Immediate  Fair; Remote Fair; Recent Fair  Judgment: Impaired  Insight: Community Education Officer  Concentration: Fair  Attention Span: Fair  Recall: [employer] of Knowledge: Fair  Language: Fair   Psychomotor Activity  Psychomotor Activity: No data recorded  Musculoskeletal: Strength & Muscle Tone: within normal limits Gait & Station: normal Assets  Assets: Manufacturing Systems Engineer; Desire for Improvement; Social Support    Physical Exam: Physical Exam Vitals and nursing note reviewed.    ROS Blood pressure 96/81, pulse 93, temperature (!) 96.4 F (35.8 C), resp. rate 18, height 5' 5 (1.651 m), weight 55.3 kg, last menstrual period 01/28/2015, SpO2 96%. Body mass index is 20.3 kg/m.  Diagnosis: Principal Problem:   Major depressive disorder, recurrent episode, moderate degree (HCC)   Clinical Decision Making: Patient currently admitted for worsening depression, hopelessness and worsening anxiety, denies SI/HI/plan, denies hallucinations.  Patient currently is not on any psychotropic medications and is requesting stabilization.  Patient has been tried on multiple psychotropic medications with minimal response.  Treatment Plan Summary:  Safety and Monitoring:             -- Voluntary admission to inpatient psychiatric unit for safety, stabilization and treatment             -- Daily contact with patient to assess and evaluate symptoms and progress in treatment             --  Patient's case to be discussed in multi-disciplinary team meeting             -- Observation Level: q15 minute checks             -- Vital signs:  q12 hours             -- Precautions: suicide, elopement, and assault   2. Psychiatric Diagnoses and Treatment:               Will try the combination of Effexor XR 37.5 mg daily and Depakote ER 500 mg Ambien  increased to 10 mg nightly will be added for insomnia   -- The risks/benefits/side-effects/alternatives to this medication were discussed in detail with the patient and time was given for questions. The patient consents to medication trial.                -- Metabolic profile and EKG monitoring obtained while on an atypical antipsychotic (BMI: Lipid Panel: HbgA1c: QTc:)              -- Encouraged patient to participate in unit milieu and in scheduled group therapies                            3. Medical Issues Being Addressed:  No urgent medical needs identified at this time  4. Discharge Planning:   -- Social work and case management to assist with discharge planning and identification of hospital follow-up needs prior to discharge  -- Estimated LOS: 3-4 days  Allyn Foil, MD 07/12/2024, 8:18 PM

## 2024-07-12 NOTE — Group Note (Signed)
 Date:  07/12/2024 Time:  10:51 AM  Group Topic/Focus:  Healthy Communication:   The focus of this group is to discuss communication, barriers to communication, as well as healthy ways to communicate with others.    Participation Level:  Did Not Attend   Leigh VEAR Pais 07/12/2024, 10:51 AM

## 2024-07-12 NOTE — Group Note (Signed)
 Date:  07/12/2024 Time:  8:30 PM  Group Topic/Focus:  Building Self Esteem:   The Focus of this group is helping patients become aware of the effects of self-esteem on their lives, the things they and others do that enhance or undermine their self-esteem, seeing the relationship between their level of self-esteem and the choices they make and learning ways to enhance self-esteem.    Participation Level:  Active  Participation Quality:  Appropriate  Affect:  Appropriate  Cognitive:  Appropriate  Insight: Good  Engagement in Group:  Engaged  Modes of Intervention:  Discussion  Additional Comments:    Carla King 07/12/2024, 8:30 PM

## 2024-07-12 NOTE — Group Note (Signed)

## 2024-07-12 NOTE — Progress Notes (Signed)
 Behavior:   Pleasant and cooperative.   Psych assessment:  Endorses anxiety and depression.  Denies SI/HI and AVH.    Interaction / Group attendance:  Isolates to room.  Minimal interaction with staff.  No interaction with peers.  Did not attend groups.   Medication/ PRNs: Compliant.  PRN medication given for anxiety.  Pain:  Denies.  15 min checks in place for safety.    Patient declined all meals and Ensure despite attempts from multiple staff members. Pt does have snacks and drinks in her room.  Reports she does not feel well, but denies nausea and does not forward any further information.

## 2024-07-13 DIAGNOSIS — F331 Major depressive disorder, recurrent, moderate: Secondary | ICD-10-CM | POA: Diagnosis not present

## 2024-07-13 NOTE — Progress Notes (Signed)
 [facility] MD Progress Note  07/13/2024 11:35 AM Carla King  MRN:  [id number]  Coudriet is a 58 year old female presenting to [facility] accompanied by her father. Pt states that she is very depressed at this time. Pt reports that she does not want to get out of bed and has been going on for 3 months. Pt reports she has not been sleeping for weeks. Pt states, I dont have SI thoughts but if I did not wake up that would be fine. 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 Patient is admitted to [facility] [address] unit with Q15 min safety monitoring. Multidisciplinary team approach is offered. Medication management; group/milieu therapy is offered.  Subjective:  Chart reviewed, case discussed in multidisciplinary meeting, patient seen during rounds.   Patient reports she continues to feel depressed and reports her anxiety being worse when she is outside the group.  She denies SI/HI/plan and denies auditory/visual hallucinations.  Provider and patient discussed about the chronicity of her depression and her failure of multiple psychotropic medications including almost all SSRIs, SNRIs, Wellbutrin.  Provider and patient discussed TMS option as outpatient.  Patient understands that it takes 4 to 6 weeks whether medications to show good difference.  Will continue to optimize the dosage of the medication.  Patient is not eating her meals and patient is encouraged to have some Ensure.  Per nursing patient is refusing Ensure.  Patient reports improved sleep with increased dose of Ambien .  Sleep: Poor  Appetite:  Fair  Past Psychiatric History: see h&P Family History: History reviewed. No pertinent family history. Social History:  Social History   Substance and Sexual Activity  Alcohol Use No     Social History   Substance and Sexual Activity  Drug Use Yes   Types: Marijuana    Social History   Socioeconomic History   Marital status: Married    Spouse  name: Not on file   Number of children: Not on file   Years of education: Not on file   Highest education level: Not on file  Occupational History   Not on file  Tobacco Use   Smoking status: Never   Smokeless tobacco: Never  Vaping Use   Vaping status: Every Day  Substance and Sexual Activity   Alcohol use: No   Drug use: Yes    Types: Marijuana   Sexual activity: Yes  Other Topics Concern   Not on file  Social History Narrative   Not on file   Social Drivers of Health   Financial Resource Strain: Low Risk  (09/16/2023)   Received from [facility]   Overall Financial Resource Strain (CARDIA)    Difficulty of Paying Living Expenses: Not hard at all  Food Insecurity: No Food Insecurity (07/09/2024)   Hunger Vital Sign    Worried About Running Out of Food in the Last Year: Never true    Ran Out of Food in the Last Year: Never true  Transportation Needs: No Transportation Needs (07/09/2024)   PRAPARE - Administrator, Civil Service (Medical): No    Lack of Transportation (Non-Medical): No  Physical Activity: Not on file  Stress: Not on file  Social Connections: Socially Isolated (07/09/2024)   Social Connection and Isolation Panel    Frequency of Communication with Friends and Family: More than three times a week    Frequency of Social Gatherings with Friends and  Family: Once a week    Attends Religious Services: Never    Active Member of Clubs or Organizations: No    Attends Engineer, Structural: Never    Marital Status: Divorced   Past Medical History:  Past Medical History:  Diagnosis Date   alcohol    colon ca 2011    Past Surgical History:  Procedure Laterality Date   AUGMENTATION MAMMAPLASTY Bilateral    LAPAROSCOPIC PARTIAL COLECTOMY Left Feb 2004    Current Medications: Current Facility-Administered Medications  Medication Dose Route Frequency Provider Last Rate Last Admin   acetaminophen  (TYLENOL ) tablet 650 mg   650 mg Oral Q6H PRN Olasunkanmi, Oluwatosin, NP   650 mg at 07/10/24 2247   diphenhydrAMINE  (BENADRYL ) capsule 50 mg  50 mg Oral Q8H PRN Olasunkanmi, Oluwatosin, NP       divalproex (DEPAKOTE ER) 24 hr tablet 500 mg  500 mg Oral Daily Liam Bossman, MD   500 mg at 07/13/24 1010   feeding supplement (ENSURE PLUS HIGH PROTEIN) liquid 237 mL  237 mL Oral TID BM Choua Chalker, MD   237 mL at 07/11/24 1409   hydrOXYzine  (ATARAX ) tablet 25 mg  25 mg Oral Q6H PRN Olasunkanmi, Oluwatosin, NP   25 mg at 07/12/24 9271   multivitamin with minerals tablet 1 tablet  1 tablet Oral Daily Gregrey Bloyd, MD   1 tablet at 07/13/24 1009   nicotine  (NICODERM CQ  - dosed in mg/24 hours) patch 21 mg  21 mg Transdermal Q0600 Pheonix Clinkscale, MD       OLANZapine  (ZYPREXA ) injection 5 mg  5 mg Intramuscular TID PRN Olasunkanmi, Oluwatosin, NP       OLANZapine  zydis (ZYPREXA ) disintegrating tablet 5 mg  5 mg Oral TID PRN Olasunkanmi, Oluwatosin, NP       traZODone  (DESYREL ) tablet 50 mg  50 mg Oral QHS PRN Olasunkanmi, Oluwatosin, NP   50 mg at 07/09/24 2235   venlafaxine XR (EFFEXOR-XR) 24 hr capsule 75 mg  75 mg Oral Q breakfast Arleatha Philipps, MD   75 mg at 07/13/24 1009   zolpidem  (AMBIEN ) tablet 10 mg  10 mg Oral QHS Shykeria Sakamoto, MD   10 mg at 07/12/24 2205    Lab Results:  No results found for this or any previous visit (from the past 48 hours).   Blood Alcohol level:  Lab Results  Component Value Date   [facility] <15 07/09/2024   ETH <15 02/03/2024    Metabolic Disorder Labs: No results found for: HGBA1C, MPG Lab Results  Component Value Date   PROLACTIN 23.6 07/09/2024   Lab Results  Component Value Date   CHOL 169 02/23/2013   TRIG 60.0 02/23/2013   HDL 68.30 02/23/2013   CHOLHDL 2 02/23/2013   VLDL 12.0 02/23/2013   LDLCALC 89 02/23/2013   LDLCALC 101 (H) 02/18/2012    Physical Findings: AIMS:  , ,  ,  ,    CIWA:    COWS:      Psychiatric Specialty Exam:  Presentation   General Appearance:  Appropriate for Environment; Casual  Eye Contact: Fair  Speech: Normal Rate  Speech Volume: Decreased    Mood and Affect  Mood: Anxious; Depressed; Dysphoric  Affect: Depressed; Flat   Thought Process  Thought Processes: Coherent  Orientation:Full (Time, Place and Person)  Thought Content:Illogical  Hallucinations: Denies  Ideas of Reference:None  Suicidal Thoughts: Denies  Homicidal Thoughts: Denies   Sensorium  Memory: Immediate Fair; Remote Fair; Recent Fair  Judgment:  Impaired  Insight: Shallow   Executive Functions  Concentration: Fair  Attention Span: Fair  Recall: [employer] of Knowledge: Fair  Language: Fair   Psychomotor Activity  Psychomotor Activity: No data recorded  Musculoskeletal: Strength & Muscle Tone: within normal limits Gait & Station: normal Assets  Assets: Manufacturing Systems Engineer; Desire for Improvement; Social Support    Physical Exam: Physical Exam Vitals and nursing note reviewed.    ROS Blood pressure 93/72, pulse 84, temperature (!) 97.5 F (36.4 C), resp. rate 16, height 5' 5 (1.651 m), weight 55.3 kg, last menstrual period 01/28/2015, SpO2 96%. Body mass index is 20.3 kg/m.  Diagnosis: Principal Problem:   Major depressive disorder, recurrent episode, moderate degree (HCC)   Clinical Decision Making: Patient currently admitted for worsening depression, hopelessness and worsening anxiety, denies SI/HI/plan, denies hallucinations.  Patient currently is not on any psychotropic medications and is requesting stabilization.  Patient has been tried on multiple psychotropic medications with minimal response.  Treatment Plan Summary:  Safety and Monitoring:             -- Voluntary admission to inpatient psychiatric unit for safety, stabilization and treatment             -- Daily contact with patient to assess and evaluate symptoms and progress in treatment             --  Patient's case to be discussed in multi-disciplinary team meeting             -- Observation Level: q15 minute checks             -- Vital signs:  q12 hours             -- Precautions: suicide, elopement, and assault   2. Psychiatric Diagnoses and Treatment:                Effexor XR 75mg  daily and Depakote ER 500 mg Ambien  increased to 10 mg nightly for insomnia   -- The risks/benefits/side-effects/alternatives to this medication were discussed in detail with the patient and time was given for questions. The patient consents to medication trial.                -- Metabolic profile and EKG monitoring obtained while on an atypical antipsychotic (BMI: Lipid Panel: HbgA1c: QTc:)              -- Encouraged patient to participate in unit milieu and in scheduled group therapies                            3. Medical Issues Being Addressed:  No urgent medical needs identified at this time  4. Discharge Planning:   -- Social work and case management to assist with discharge planning and identification of hospital follow-up needs prior to discharge  -- Estimated LOS: 3-4 days  Allyn Foil, MD 07/13/2024, 11:35 AM

## 2024-07-13 NOTE — Group Note (Signed)
 Date:  07/13/2024 Time:  10:18 AM  Group Topic/Focus:  Movement Therapy    Participation Level:  Did Not Attend    Carla King 07/13/2024, 10:18 AM

## 2024-07-13 NOTE — Plan of Care (Signed)
  Problem: Coping: Goal: Will verbalize feelings Outcome: Progressing   Problem: Activity: Goal: Interest or engagement in leisure activities will improve Outcome: Not Progressing   Problem: Coping: Goal: Coping ability will improve Outcome: Not Progressing   Problem: Self-Concept: Goal: Will verbalize positive feelings about self Outcome: Not Progressing

## 2024-07-13 NOTE — Progress Notes (Signed)
 Behavior:  Isolates.  Cooperative. SABRA   Psych assessment:  Endorses severe depression.  Denies SI/HI and AVH.    Interaction / Group attendance:  Isolates to room.  Refuses all meals.  No interaction with peers.  Medication/ PRNs: Compliant. PRN medication given for anxiety as ordered  Pain: Denies  15 min checks in place for safety.    Patient did not eat breakfast or lunch.  Per Dr. JINNY, dinner pt was allowed to have dinner in her room.  Pt consumed roughly 5%.

## 2024-07-13 NOTE — Group Note (Signed)
 Physical/Occupational Therapy Group Note  Group Topic: Pain Management and Coping   Group Date: 07/13/2024 Start Time: 1305 End Time: 1405 Facilitators: Clive Warren CROME, OT   Group Description:  Group discussed impact of chronic/acute pain on safety and independence with functional tasks and impact on mental health.  Identified and discussed any previously learned or implemented strategies used.  Discussed and reviewed cognitive behavioral pain coping strategies to address/improve overall management of pain. Discussed relaxation, distraction techniques, cognitive restructuring, activity pacing/energy conservation, environment/home safety modifications, and role of sleep and sleep hygiene. Allowed time for questions and further discussion.      Therapeutic Goal(s):   Identify and discuss previously utilized pain coping strategies and implications of pain on function/well-being Identify and discuss implementing new cognitive behavioral pain coping strategies into daily routines Demonstrate understanding and performance of learned cognitive behavioral pain coping strategies.    Individual Participation: Pt did not attend.   Participation Level: Did not attend   Participation Quality:   Behavior:   Speech/Thought Process:   Affect/Mood:   Insight:   Judgement:   Modes of Intervention:   Patient Response to Interventions:    Plan: Continue to engage patient in PT/OT groups 1 - 2x/week.  Cherylee Rawlinson R., MPH, MS, OTR/L ascom [phone] 07/13/24, 4:28 PM

## 2024-07-13 NOTE — Progress Notes (Signed)
   07/13/24 0000  Psych Admission Type (Psych Patients Only)  Admission Status Voluntary  Psychosocial Assessment  Patient Complaints Depression;Sadness  Eye Contact Brief  Facial Expression Sad  Affect Sad  Speech Logical/coherent;Soft  Interaction Isolative;Minimal  Motor Activity Slow  Appearance/Hygiene Unremarkable  Behavior Characteristics Cooperative;Calm  Mood Sad;Depressed  Thought Process  Coherency WDL  Content WDL  Delusions None reported or observed  Perception WDL  Hallucination None reported or observed  Judgment Poor  Confusion None  Danger to Self  Current suicidal ideation? Denies  Danger to Others  Danger to Others None reported or observed

## 2024-07-13 NOTE — Group Note (Signed)
 Recreation Therapy Group Note   Group Topic:Other  Group Date: 07/13/2024 Start Time: 1400 End Time: 1500 Facilitators: Celestia Jeoffrey BRAVO, LRT, CTRS Location: Dayroom and Hallway  Group Description: Bag Decoration and Trick or Treating.  Patients received a brown paper bag to decorate for Halloween. Once complete, patients walked down the hall, with Halloween music playing in the background, to go trick or treating with LRT. Patients received candy with RN approval.   Goal Area(s) Addressed:  Patients will express themselves through art Patients will recall past holiday memories  Patients will increase communication    Affect/Mood: N/A   Participation Level: Did not attend    Clinical Observations/Individualized Feedback: Patient did not attend group.   Plan: Continue to engage patient in RT group sessions 2-3x/week.   Jeoffrey BRAVO Celestia, LRT, CTRS 07/13/2024 3:10 PM

## 2024-07-14 DIAGNOSIS — F331 Major depressive disorder, recurrent, moderate: Secondary | ICD-10-CM | POA: Diagnosis not present

## 2024-07-14 DIAGNOSIS — F332 Major depressive disorder, recurrent severe without psychotic features: Secondary | ICD-10-CM

## 2024-07-14 NOTE — Plan of Care (Signed)
   Problem: Coping: Goal: Will verbalize feelings Outcome: Progressing

## 2024-07-14 NOTE — Progress Notes (Signed)
   07/14/24 1000  Psych Admission Type (Psych Patients Only)  Admission Status Voluntary  Psychosocial Assessment  Patient Complaints Worrying;Depression  Eye Contact Brief  Facial Expression Sad  Affect Depressed  Speech Logical/coherent  Interaction Minimal  Motor Activity Slow  Appearance/Hygiene Unremarkable  Behavior Characteristics Cooperative  Mood Depressed  Thought Process  Coherency WDL  Content WDL  Delusions None reported or observed  Perception WDL  Hallucination None reported or observed  Judgment Impaired  Confusion WDL  Danger to Self  Current suicidal ideation? Denies  Danger to Others  Danger to Others None reported or observed

## 2024-07-14 NOTE — Plan of Care (Signed)
   Problem: Activity: Goal: Interest or engagement in leisure activities will improve Outcome: Progressing Goal: Imbalance in normal sleep/wake cycle will improve Outcome: Progressing

## 2024-07-14 NOTE — Group Note (Signed)
 Date:  07/14/2024 Time:  10:33 AM  Group Topic/Focus:  Coping With Mental Health Crisis:   The purpose of this group is to help patients identify strategies for coping with mental health crisis.  Group discusses possible causes of crisis and ways to manage them effectively.    Participation Level:  Did Not Attend  Leigh VEAR Pais 07/14/2024, 10:33 AM

## 2024-07-14 NOTE — Group Note (Signed)
 Date:  07/14/2024 Time:  4:05 PM  Group Topic/Focus:  Leisure/Bingo Group: The purpose of this group is to allow patients to participate in a fun activity with peers and interact with each other. [                                                                                                                                Participation Level:  Did Not Attend  Beatris ONEIDA Hasten 07/14/2024, 4:05 PM

## 2024-07-14 NOTE — Progress Notes (Signed)
 [facility] MD Progress Note  07/14/2024 2:58 PM Carla King  MRN:  [id number]  Carla King is a 58 year old female presenting to [facility] accompanied by her father. Pt states that she is very depressed at this time. Pt reports that she does not want to get out of bed and has been going on for 3 months. Pt reports she has not been sleeping for weeks. Pt states, I dont have SI thoughts but if I did not wake up that would be fine. 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 Patient is admitted to [facility] unit with Q15 min safety monitoring. Multidisciplinary team approach is offered. Medication management; group/milieu therapy is offered.   07/14/2024: Subjective:  Chart reviewed, case discussed in multidisciplinary meeting, patient seen during rounds. Patient is lying in bed and states that she continues to be very depressed and anxious. Explains that she has been nauseated after breakfast and worries that her depression will not improve. 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 Patient is a bit resistant to be outside of her room due to increased anxiety. She has been compliant with her treatment plan and has not required PRN medications in the past 24 hours. Remains alert and oriented x4. Denies SI, HI, AVH.   07/13/2024: Subjective:  Chart reviewed, case discussed in multidisciplinary meeting, patient seen during rounds.   Patient reports she continues to feel depressed and reports her anxiety being worse when she is outside the group.  She denies SI/HI/plan and denies auditory/visual hallucinations.  Provider and patient discussed about the chronicity of her depression and her failure of multiple psychotropic medications including almost all SSRIs, SNRIs, Wellbutrin.  Provider and patient discussed TMS option as outpatient.  Patient understands that it takes 4 to 6 weeks whether medications to  show good difference.  Will continue to optimize the dosage of the medication.  Patient is not eating her meals and patient is encouraged to have some Ensure.  Per nursing patient is refusing Ensure.  Patient reports improved sleep with increased dose of Ambien .  Sleep: Poor  Appetite:  Fair  Past Psychiatric History: see h&P Family History: History reviewed. No pertinent family history. Social History:  Social History   Substance and Sexual Activity  Alcohol Use No     Social History   Substance and Sexual Activity  Drug Use Yes   Types: Marijuana    Social History   Socioeconomic History   Marital status: Married    Spouse name: Not on file   Number of children: Not on file   Years of education: Not on file   Highest education level: Not on file  Occupational History   Not on file  Tobacco Use   Smoking status: Never   Smokeless tobacco: Never  Vaping Use   Vaping status: Every Day  Substance and Sexual Activity   Alcohol use: No   Drug use: Yes    Types: Marijuana   Sexual activity: Yes  Other Topics Concern   Not on file  Social History Narrative   Not on file   Social Drivers of Health   Financial Resource Strain: Low Risk  (09/16/2023)   Received from [facility]   Overall Financial Resource Strain (CARDIA)    Difficulty of Paying Living Expenses: Not hard at all  Food Insecurity: No Food Insecurity (07/09/2024)   Hunger Vital  Sign    Worried About Programme Researcher, Broadcasting/film/video in the Last Year: Never true    Ran Out of Food in the Last Year: Never true  Transportation Needs: No Transportation Needs (07/09/2024)   PRAPARE - Administrator, Civil Service (Medical): No    Lack of Transportation (Non-Medical): No  Physical Activity: Not on file  Stress: Not on file  Social Connections: Socially Isolated (07/09/2024)   Social Connection and Isolation Panel    Frequency of Communication with Friends and Family: More than three times a  week    Frequency of Social Gatherings with Friends and Family: Once a week    Attends Religious Services: Never    Database Administrator or Organizations: No    Attends Engineer, Structural: Never    Marital Status: Divorced   Past Medical History:  Past Medical History:  Diagnosis Date   alcohol    colon ca 2011    Past Surgical History:  Procedure Laterality Date   AUGMENTATION MAMMAPLASTY Bilateral    LAPAROSCOPIC PARTIAL COLECTOMY Left Feb 2004    Current Medications: Current Facility-Administered Medications  Medication Dose Route Frequency Provider Last Rate Last Admin   acetaminophen  (TYLENOL ) tablet 650 mg  650 mg Oral Q6H PRN Olasunkanmi, Oluwatosin, NP   650 mg at 07/10/24 2247   diphenhydrAMINE  (BENADRYL ) capsule 50 mg  50 mg Oral Q8H PRN Olasunkanmi, Oluwatosin, NP       divalproex (DEPAKOTE ER) 24 hr tablet 500 mg  500 mg Oral Daily Jadapalle, Sree, MD   500 mg at 07/14/24 0931   feeding supplement (ENSURE PLUS HIGH PROTEIN) liquid 237 mL  237 mL Oral TID BM Jadapalle, Sree, MD   237 mL at 07/14/24 1419   hydrOXYzine  (ATARAX ) tablet 25 mg  25 mg Oral Q6H PRN Olasunkanmi, Oluwatosin, NP   25 mg at 07/14/24 0931   multivitamin with minerals tablet 1 tablet  1 tablet Oral Daily Jadapalle, Sree, MD   1 tablet at 07/14/24 0931   nicotine  (NICODERM CQ  - dosed in mg/24 hours) patch 21 mg  21 mg Transdermal Q0600 Jadapalle, Sree, MD       OLANZapine  (ZYPREXA ) injection 5 mg  5 mg Intramuscular TID PRN Olasunkanmi, Oluwatosin, NP       OLANZapine  zydis (ZYPREXA ) disintegrating tablet 5 mg  5 mg Oral TID PRN Olasunkanmi, Oluwatosin, NP       traZODone  (DESYREL ) tablet 50 mg  50 mg Oral QHS PRN Olasunkanmi, Oluwatosin, NP   50 mg at 07/09/24 2235   venlafaxine XR (EFFEXOR-XR) 24 hr capsule 75 mg  75 mg Oral Q breakfast Jadapalle, Sree, MD   75 mg at 07/14/24 0931   zolpidem  (AMBIEN ) tablet 10 mg  10 mg Oral QHS Jadapalle, Sree, MD   10 mg at 07/13/24 2103    Lab  Results:  No results found for this or any previous visit (from the past 48 hours).   Blood Alcohol level:  Lab Results  Component Value Date   [facility] <15 07/09/2024   ETH <15 02/03/2024    Metabolic Disorder Labs: No results found for: HGBA1C, MPG Lab Results  Component Value Date   PROLACTIN 23.6 07/09/2024   Lab Results  Component Value Date   CHOL 169 02/23/2013   TRIG 60.0 02/23/2013   HDL 68.30 02/23/2013   CHOLHDL 2 02/23/2013   VLDL 12.0 02/23/2013   LDLCALC 89 02/23/2013   LDLCALC 101 (H) 02/18/2012    Physical  Findings: AIMS:  , ,  ,  ,    CIWA:    COWS:      Psychiatric Specialty Exam:  Presentation  General Appearance:  Appropriate for Environment; Casual  Eye Contact: Fair  Speech: Normal Rate  Speech Volume: Decreased    Mood and Affect  Mood: Anxious; Depressed; Dysphoric  Affect: Depressed; Flat   Thought Process  Thought Processes: Coherent  Orientation:Full (Time, Place and Person)  Thought Content:Illogical  Hallucinations: Denies  Ideas of Reference:None  Suicidal Thoughts: Denies  Homicidal Thoughts: Denies   Sensorium  Memory: Immediate Fair; Remote Fair; Recent Fair  Judgment: Impaired  Insight: Shallow   Executive Functions  Concentration: Fair  Attention Span: Fair  Recall: [employer] of Knowledge: Fair  Language: Fair   Psychomotor Activity  Psychomotor Activity: No data recorded  Musculoskeletal: Strength & Muscle Tone: within normal limits Gait & Station: normal Assets  Assets: Manufacturing Systems Engineer; Desire for Improvement; Social Support    Physical Exam: Physical Exam Vitals and nursing note reviewed.    Review of Systems  Constitutional: Negative.   HENT: Negative.    Eyes: Negative.   Respiratory: Negative.    Cardiovascular: Negative.   Gastrointestinal: Negative.   Genitourinary: Negative.   Musculoskeletal: Negative.   Skin: Negative.   Neurological:  Negative.   Endo/Heme/Allergies: Negative.   Psychiatric/Behavioral:  Positive for depression. The patient is nervous/anxious.    Blood pressure 99/66, pulse 74, temperature (!) 97.4 F (36.3 C), resp. rate 15, height 5' 5 (1.651 m), weight 55.3 kg, last menstrual period 01/28/2015, SpO2 94%. Body mass index is 20.3 kg/m.  Diagnosis: Principal Problem:   Major depressive disorder, recurrent episode, moderate degree (HCC)   Clinical Decision Making: Patient currently admitted for worsening depression, hopelessness and worsening anxiety, denies SI/HI/plan, denies hallucinations.  Patient currently is not on any psychotropic medications and is requesting stabilization.  Patient has been tried on multiple psychotropic medications with minimal response.  Treatment Plan Summary:  Safety and Monitoring:             -- Voluntary admission to inpatient psychiatric unit for safety, stabilization and treatment             -- Daily contact with patient to assess and evaluate symptoms and progress in treatment             -- Patient's case to be discussed in multi-disciplinary team meeting             -- Observation Level: q15 minute checks             -- Vital signs:  q12 hours             -- Precautions: suicide, elopement, and assault   2. Psychiatric Diagnoses and Treatment:   Continue current medications:            Effexor XR 75mg  daily and Depakote ER 500 mg Ambien  10 mg nightly for insomnia   -- The risks/benefits/side-effects/alternatives to this medication were discussed in detail with the patient and time was given for questions. The patient consents to medication trial.                -- Metabolic profile and EKG monitoring obtained while on an atypical antipsychotic (BMI: Lipid Panel: HbgA1c: QTc:)              -- Encouraged patient to participate in unit milieu and in scheduled group therapies  3. Medical Issues Being Addressed:  No urgent medical needs  identified at this time  4. Discharge Planning:   -- Social work and case management to assist with discharge planning and identification of hospital follow-up needs prior to discharge  -- Estimated LOS: 3-4 days  Daine KATHEE Ober, NP 07/14/2024, 2:58 PM

## 2024-07-15 DIAGNOSIS — F332 Major depressive disorder, recurrent severe without psychotic features: Secondary | ICD-10-CM | POA: Diagnosis not present

## 2024-07-15 MED ORDER — VENLAFAXINE HCL ER 75 MG PO CP24
150.0000 mg | ORAL_CAPSULE | Freq: Every day | ORAL | Status: DC
  Administered 2024-07-16 – 2024-07-20 (×5): 150 mg via ORAL
  Filled 2024-07-15 (×5): qty 2

## 2024-07-15 NOTE — Progress Notes (Signed)
 [facility] MD Progress Note  07/15/2024 12:49 PM RION SCHNITZER  MRN:  [id number]  Martinovich is a 58 year old female presenting to [facility] accompanied by her father. Pt states that she is very depressed at this time. Pt reports that she does not want to get out of bed and has been going on for 3 months. Pt reports she has not been sleeping for weeks. Pt states, I dont have SI thoughts but if I did not wake up that would be fine. 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 Patient is admitted to [facility] unit with Q15 min safety monitoring. Multidisciplinary team approach is offered. Medication management; group/milieu therapy is offered.   07/15/2024: Subjective:  Chart reviewed, case discussed in multidisciplinary meeting, patient seen during rounds.Patient is again lying in bed and reports no improvement in her depressive symptoms.  I do not think I will ever get better.  She continues to refuse to talk to anyone per family at this time.  She did eat some of her dinner yesterday did not eat breakfast this morning.  Complains of diarrhea that she relates to drinking Ensure.  Reports that she slept well.  Concerns from staff regards to continuous isolation.  She denies SI HI or AVH at this time.  Continues to be alert and oriented x 4.  Eye contact is pretty good.  Reports that she is taking a shower as needed.  07/14/2024: Subjective:  Chart reviewed, case discussed in multidisciplinary meeting, patient seen during rounds. Patient is lying in bed and states that she continues to be very depressed and anxious. Explains that she has been nauseated after breakfast and worries that her depression will not improve. 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 Patient is a bit resistant to be outside of her room due to increased anxiety. She has been compliant with her treatment plan and has not required  PRN medications in the past 24 hours. Remains alert and oriented x4. Denies SI, HI, AVH.   07/13/2024: Subjective:  Chart reviewed, case discussed in multidisciplinary meeting, patient seen during rounds.   Patient reports she continues to feel depressed and reports her anxiety being worse when she is outside the group.  She denies SI/HI/plan and denies auditory/visual hallucinations.  Provider and patient discussed about the chronicity of her depression and her failure of multiple psychotropic medications including almost all SSRIs, SNRIs, Wellbutrin.  Provider and patient discussed TMS option as outpatient.  Patient understands that it takes 4 to 6 weeks whether medications to show good difference.  Will continue to optimize the dosage of the medication.  Patient is not eating her meals and patient is encouraged to have some Ensure.  Per nursing patient is refusing Ensure.  Patient reports improved sleep with increased dose of Ambien .  Sleep: Poor  Appetite:  Fair  Past Psychiatric History: see h&P Family History: History reviewed. No pertinent family history. Social History:  Social History   Substance and Sexual Activity  Alcohol Use No     Social History   Substance and Sexual Activity  Drug Use Yes   Types: Marijuana    Social History   Socioeconomic History   Marital status: Married    Spouse name: Not on file   Number of children: Not on file   Years of education: Not on file   Highest education level: Not on file  Occupational History   Not on file  Tobacco Use   Smoking status: Never   Smokeless tobacco: Never  Vaping Use   Vaping status: Every Day  Substance and Sexual Activity   Alcohol use: No   Drug use: Yes    Types: Marijuana   Sexual activity: Yes  Other Topics Concern   Not on file  Social History Narrative   Not on file   Social Drivers of Health   Financial Resource Strain: Low Risk  (09/16/2023)   Received from [facility]    Overall Financial Resource Strain (CARDIA)    Difficulty of Paying Living Expenses: Not hard at all  Food Insecurity: No Food Insecurity (07/09/2024)   Hunger Vital Sign    Worried About Running Out of Food in the Last Year: Never true    Ran Out of Food in the Last Year: Never true  Transportation Needs: No Transportation Needs (07/09/2024)   PRAPARE - Administrator, Civil Service (Medical): No    Lack of Transportation (Non-Medical): No  Physical Activity: Not on file  Stress: Not on file  Social Connections: Socially Isolated (07/09/2024)   Social Connection and Isolation Panel    Frequency of Communication with Friends and Family: More than three times a week    Frequency of Social Gatherings with Friends and Family: Once a week    Attends Religious Services: Never    Database Administrator or Organizations: No    Attends Engineer, Structural: Never    Marital Status: Divorced   Past Medical History:  Past Medical History:  Diagnosis Date   alcohol    colon ca 2011    Past Surgical History:  Procedure Laterality Date   AUGMENTATION MAMMAPLASTY Bilateral    LAPAROSCOPIC PARTIAL COLECTOMY Left Feb 2004    Current Medications: Current Facility-Administered Medications  Medication Dose Route Frequency Provider Last Rate Last Admin   acetaminophen  (TYLENOL ) tablet 650 mg  650 mg Oral Q6H PRN Olasunkanmi, Oluwatosin, NP   650 mg at 07/10/24 2247   diphenhydrAMINE  (BENADRYL ) capsule 50 mg  50 mg Oral Q8H PRN Olasunkanmi, Oluwatosin, NP       divalproex (DEPAKOTE ER) 24 hr tablet 500 mg  500 mg Oral Daily Jadapalle, Sree, MD   500 mg at 07/15/24 0902   feeding supplement (ENSURE PLUS HIGH PROTEIN) liquid 237 mL  237 mL Oral TID BM Jadapalle, Sree, MD   237 mL at 07/15/24 9096   hydrOXYzine  (ATARAX ) tablet 25 mg  25 mg Oral Q6H PRN Olasunkanmi, Oluwatosin, NP   25 mg at 07/15/24 1110   multivitamin with minerals tablet 1 tablet  1 tablet Oral Daily Jadapalle,  Sree, MD   1 tablet at 07/15/24 0902   nicotine  (NICODERM CQ  - dosed in mg/24 hours) patch 21 mg  21 mg Transdermal Q0600 Jadapalle, Sree, MD       OLANZapine  (ZYPREXA ) injection 5 mg  5 mg Intramuscular TID PRN Olasunkanmi, Oluwatosin, NP       OLANZapine  zydis (ZYPREXA ) disintegrating tablet 5 mg  5 mg Oral TID PRN Olasunkanmi, Oluwatosin, NP       traZODone  (DESYREL ) tablet 50 mg  50 mg Oral QHS PRN Olasunkanmi, Oluwatosin, NP   50 mg at 07/09/24 2235   venlafaxine XR (EFFEXOR-XR) 24 hr capsule 75 mg  75 mg Oral Q breakfast Jadapalle, Sree, MD   75 mg at 07/15/24 0902   zolpidem  (AMBIEN ) tablet 10 mg  10 mg  Oral QHS Jadapalle, Sree, MD   10 mg at 07/14/24 2118    Lab Results:  No results found for this or any previous visit (from the past 48 hours).   Blood Alcohol level:  Lab Results  Component Value Date   [facility] <15 07/09/2024   ETH <15 02/03/2024    Metabolic Disorder Labs: No results found for: HGBA1C, MPG Lab Results  Component Value Date   PROLACTIN 23.6 07/09/2024   Lab Results  Component Value Date   CHOL 169 02/23/2013   TRIG 60.0 02/23/2013   HDL 68.30 02/23/2013   CHOLHDL 2 02/23/2013   VLDL 12.0 02/23/2013   LDLCALC 89 02/23/2013   LDLCALC 101 (H) 02/18/2012    Physical Findings: AIMS:  , ,  ,  ,    CIWA:    COWS:      Psychiatric Specialty Exam:  Presentation  General Appearance:  Appropriate for Environment; Casual  Eye Contact: Fair  Speech: Normal Rate  Speech Volume: Decreased    Mood and Affect  Mood: Anxious; Depressed; Dysphoric  Affect: Depressed; Flat   Thought Process  Thought Processes: Coherent  Orientation:Full (Time, Place and Person)  Thought Content:Illogical  Hallucinations: Denies  Ideas of Reference:None  Suicidal Thoughts: Denies  Homicidal Thoughts: Denies   Sensorium  Memory: Immediate Fair; Remote Fair; Recent Fair  Judgment: Impaired  Insight: Shallow   Executive Functions   Concentration: Fair  Attention Span: Fair  Recall: [employer] of Knowledge: Fair  Language: Fair   Psychomotor Activity  Psychomotor Activity: No data recorded  Musculoskeletal: Strength & Muscle Tone: within normal limits Gait & Station: normal Assets  Assets: Manufacturing Systems Engineer; Desire for Improvement; Social Support    Physical Exam: Physical Exam Vitals and nursing note reviewed.    Review of Systems  Constitutional: Negative.   HENT: Negative.    Eyes: Negative.   Respiratory: Negative.    Cardiovascular: Negative.   Gastrointestinal: Negative.   Genitourinary: Negative.   Musculoskeletal: Negative.   Skin: Negative.   Neurological: Negative.   Endo/Heme/Allergies: Negative.   Psychiatric/Behavioral:  Positive for depression. The patient is nervous/anxious.    Blood pressure 93/71, pulse 99, temperature (!) 97.3 F (36.3 C), resp. rate 16, height 5' 5 (1.651 m), weight 55.3 kg, last menstrual period 01/28/2015, SpO2 98%. Body mass index is 20.3 kg/m.  Diagnosis: Principal Problem:   Major depressive disorder, recurrent episode, moderate degree (HCC) Active Problems:   Severe recurrent major depression without psychotic features [facility])   Clinical Decision Making: Patient currently admitted for worsening depression, hopelessness and worsening anxiety, denies SI/HI/plan, denies hallucinations.  Patient currently is not on any psychotropic medications and is requesting stabilization.  Patient has been tried on multiple psychotropic medications with minimal response.  Treatment Plan Summary:  Safety and Monitoring:             -- Voluntary admission to inpatient psychiatric unit for safety, stabilization and treatment             -- Daily contact with patient to assess and evaluate symptoms and progress in treatment             -- Patient's case to be discussed in multi-disciplinary team meeting             -- Observation Level: q15 minute  checks             -- Vital signs:  q12 hours             --  Precautions: suicide, elopement, and assault   2. Psychiatric Diagnoses and Treatment:   Continue current medications:            Increase Effexor XR 150 mg daily and Depakote ER 500 mg Ambien  10 mg nightly for insomnia   -- The risks/benefits/side-effects/alternatives to this medication were discussed in detail with the patient and time was given for questions. The patient consents to medication trial.                -- Metabolic profile and EKG monitoring obtained while on an atypical antipsychotic (BMI: Lipid Panel: HbgA1c: QTc:)              -- Encouraged patient to participate in unit milieu and in scheduled group therapies                            3. Medical Issues Being Addressed:  No urgent medical needs identified at this time  4. Discharge Planning:   -- Social work and case management to assist with discharge planning and identification of hospital follow-up needs prior to discharge  -- Estimated LOS: 3-4 days  Daine KATHEE Ober, NP 07/15/2024, 12:49 PM

## 2024-07-15 NOTE — Plan of Care (Signed)
  Problem: Coping: Goal: Will verbalize feelings 07/15/2024 1647 by Rendell Cooper SAILOR, RN Outcome: Progressing 07/15/2024 0517 by Rendell Cooper SAILOR, RN Outcome: Progressing   Problem: Activity: Goal: Imbalance in normal sleep/wake cycle will improve Outcome: Progressing   Problem: Coping: Goal: Coping ability will improve 07/15/2024 1647 by Rendell Cooper SAILOR, RN Outcome: Progressing 07/15/2024 0517 by Rendell Cooper SAILOR, RN Outcome: Progressing   Problem: Self-Concept: Goal: Level of anxiety will decrease Outcome: Progressing

## 2024-07-15 NOTE — Plan of Care (Signed)
  Problem: Coping: Goal: Will verbalize feelings Outcome: Progressing   Problem: Coping: Goal: Coping ability will improve Outcome: Progressing   Problem: Self-Concept: Goal: Level of anxiety will decrease Outcome: Progressing

## 2024-07-15 NOTE — Group Note (Signed)
 [facility] LCSW Group Therapy Note   Group Date: 07/15/2024 Start Time: 1300 End Time: 1400   Type of Therapy/Topic:  Group Therapy:  Emotion Regulation  Participation Level:  Minimal   Mood:   Dysthymic  Description of Group:    The purpose of this group is to assist patients in learning to regulate negative emotions and experience positive emotions. Patients will be guided to discuss ways in which they have been vulnerable to their negative emotions. These vulnerabilities will be juxtaposed with experiences of positive emotions or situations, and patients challenged to use positive emotions to combat negative ones. Special emphasis will be placed on coping with negative emotions in conflict situations, and patients will process healthy conflict resolution skills.  Therapeutic Goals: Patient will identify two positive emotions or experiences to reflect on in order to balance out negative emotions:  Patient will label two or more emotions that they find the most difficult to experience:  Patient will be able to demonstrate positive conflict resolution skills through discussion or role plays:   Summary of Patient Progress:   Patient was attentive yet quiet during today's group session.    Therapeutic Modalities:   Cognitive Behavioral Therapy Feelings Identification Dialectical Behavioral Therapy   [facility] Rozetta Stumpp, LCSW

## 2024-07-15 NOTE — Progress Notes (Signed)
   07/15/24 0016  Psych Admission Type (Psych Patients Only)  Admission Status Voluntary  Psychosocial Assessment  Patient Complaints Worrying;Depression  Eye Contact Brief  Facial Expression Sad  Affect Depressed  Speech Logical/coherent  Interaction Minimal  Motor Activity Slow  Appearance/Hygiene Unremarkable  Behavior Characteristics Cooperative  Mood Depressed  Thought Process  Coherency WDL  Content WDL  Delusions None reported or observed  Perception WDL  Hallucination None reported or observed  Judgment Impaired  Confusion WDL  Danger to Self  Current suicidal ideation? Denies  Danger to Others  Danger to Others None reported or observed

## 2024-07-15 NOTE — Group Note (Signed)
 Date:  07/15/2024 Time:  10:25 AM  Group Topic/Focus:  Movement Therapy    Participation Level:  Did Not Attend     [name] Bias 07/15/2024, 10:25 AM

## 2024-07-15 NOTE — Progress Notes (Signed)
   07/15/24 1200  Psych Admission Type (Psych Patients Only)  Admission Status Voluntary  Psychosocial Assessment  Eye Contact Brief  Facial Expression Sad  Affect Depressed  Speech Logical/coherent  Interaction Minimal  Motor Activity Slow  Appearance/Hygiene Unremarkable  Behavior Characteristics Cooperative  Mood Depressed  Thought Process  Coherency WDL  Content WDL  Delusions None reported or observed  Perception WDL  Hallucination None reported or observed  Judgment Impaired  Confusion WDL  Danger to Self  Current suicidal ideation? Denies  Danger to Others  Danger to Others None reported or observed

## 2024-07-16 DIAGNOSIS — F332 Major depressive disorder, recurrent severe without psychotic features: Secondary | ICD-10-CM

## 2024-07-16 MED ORDER — DIVALPROEX SODIUM ER 500 MG PO TB24
1000.0000 mg | ORAL_TABLET | Freq: Every day | ORAL | Status: DC
  Administered 2024-07-17 – 2024-07-19 (×3): 1000 mg via ORAL
  Filled 2024-07-16 (×4): qty 2

## 2024-07-16 NOTE — Plan of Care (Signed)
  Problem: Coping: Goal: Will verbalize feelings 07/16/2024 0506 by Rendell Cooper SAILOR, RN Outcome: Progressing 07/15/2024 1647 by Rendell Cooper SAILOR, RN Outcome: Progressing   Problem: Education: Goal: Utilization of techniques to improve thought processes will improve Outcome: Progressing   Problem: Activity: Goal: Imbalance in normal sleep/wake cycle will improve 07/16/2024 0506 by Rendell Cooper SAILOR, RN Outcome: Progressing 07/15/2024 1647 by Rendell Cooper SAILOR, RN Outcome: Progressing   Problem: Coping: Goal: Coping ability will improve Outcome: Progressing

## 2024-07-16 NOTE — Progress Notes (Signed)
 [facility] MD Progress Note  07/16/2024 12:48 PM [facility]la [facility]  MRN:  [id number]  Carla King is a 58 year old female presenting to [facility] accompanied by her father. Pt states that she is very depressed at this time. Pt reports that she does not want to get out of bed and has been going on for 3 months. Pt reports she has not been sleeping for weeks. Pt states, I dont have SI thoughts but if I did not wake up that would be fine. 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 Patient is admitted to [facility] unit with Q15 min safety monitoring. Multidisciplinary team approach is offered. Medication management; group/milieu therapy is offered.   Subjective:  Chart reviewed, case discussed in multidisciplinary meeting, patient seen during rounds. Patient is noted to be sleeping in her room.  She offers no complaints and reports feeling the same.  She is tolerating the medications with no reported side effects.  She acknowledges chronic depression and poor motivation.  She is interested in pursuing TMS after discharge.  She denies SI/HI/plan and denies auditory/visual hallucinations.  She agreed to increase the Depakote to next higher dose for mood stabilization.  She was encouraged to keep up with the nutrition and patient agreed to increase her intake.  Sleep: Poor  Appetite:  Fair  Past Psychiatric History: see h&P Family History: History reviewed. No pertinent family history. Social History:  Social History   Substance and Sexual Activity  Alcohol Use No     Social History   Substance and Sexual Activity  Drug Use Yes   Types: Marijuana    Social History   Socioeconomic History   Marital status: Married    Spouse name: Not on file   Number of children: Not on file   Years of education: Not on file   Highest education level: Not on file  Occupational History   Not on file  Tobacco Use   Smoking status: Never   Smokeless tobacco:  Never  Vaping Use   Vaping status: Every Day  Substance and Sexual Activity   Alcohol use: No   Drug use: Yes    Types: Marijuana   Sexual activity: Yes  Other Topics Concern   Not on file  Social History Narrative   Not on file   Social Drivers of Health   Financial Resource Strain: Low Risk  (09/16/2023)   Received from [facility]   Overall Financial Resource Strain (CARDIA)    Difficulty of Paying Living Expenses: Not hard at all  Food Insecurity: No Food Insecurity (07/09/2024)   Hunger Vital Sign    Worried About Running Out of Food in the Last Year: Never true    Ran Out of Food in the Last Year: Never true  Transportation Needs: No Transportation Needs (07/09/2024)   PRAPARE - Administrator, Civil Service (Medical): No    Lack of Transportation (Non-Medical): No  Physical Activity: Not on file  Stress: Not on file  Social Connections: Socially Isolated (07/09/2024)   Social Connection and Isolation Panel    Frequency of Communication with Friends and Family: More than three times a week    Frequency of Social Gatherings with Friends and Family: Once a week    Attends Religious Services: Never    Database Administrator or Organizations: No    Attends Banker Meetings: Never  Marital Status: Divorced   Past Medical History:  Past Medical History:  Diagnosis Date   alcohol    colon ca 2011    Past Surgical History:  Procedure Laterality Date   AUGMENTATION MAMMAPLASTY Bilateral    LAPAROSCOPIC PARTIAL COLECTOMY Left Feb 2004    Current Medications: Current Facility-Administered Medications  Medication Dose Route Frequency Provider Last Rate Last Admin   acetaminophen  (TYLENOL ) tablet 650 mg  650 mg Oral Q6H PRN Olasunkanmi, Oluwatosin, NP   650 mg at 07/10/24 2247   diphenhydrAMINE  (BENADRYL ) capsule 50 mg  50 mg Oral Q8H PRN Olasunkanmi, Oluwatosin, NP       [START ON 07/17/2024] divalproex (DEPAKOTE ER) 24 hr  tablet 1,000 mg  1,000 mg Oral QHS Makila Colombe, MD       feeding supplement (ENSURE PLUS HIGH PROTEIN) liquid 237 mL  237 mL Oral TID BM Merrel Crabbe, MD   237 mL at 07/15/24 1353   hydrOXYzine  (ATARAX ) tablet 25 mg  25 mg Oral Q6H PRN Olasunkanmi, Oluwatosin, NP   25 mg at 07/16/24 0904   multivitamin with minerals tablet 1 tablet  1 tablet Oral Daily Julie-Anne Torain, MD   1 tablet at 07/16/24 0943   nicotine  (NICODERM CQ  - dosed in mg/24 hours) patch 21 mg  21 mg Transdermal Q0600 Alee Katen, MD       OLANZapine  (ZYPREXA ) injection 5 mg  5 mg Intramuscular TID PRN Olasunkanmi, Oluwatosin, NP       OLANZapine  zydis (ZYPREXA ) disintegrating tablet 5 mg  5 mg Oral TID PRN Olasunkanmi, Oluwatosin, NP       traZODone  (DESYREL ) tablet 50 mg  50 mg Oral QHS PRN Olasunkanmi, Oluwatosin, NP   50 mg at 07/09/24 2235   venlafaxine XR (EFFEXOR-XR) 24 hr capsule 150 mg  150 mg Oral Q breakfast Hampton, Tracie B, NP   150 mg at 07/16/24 0739   zolpidem  (AMBIEN ) tablet 10 mg  10 mg Oral QHS Connor Meacham, MD   10 mg at 07/15/24 2121    Lab Results:  No results found for this or any previous visit (from the past 48 hours).   Blood Alcohol level:  Lab Results  Component Value Date   [facility] <15 07/09/2024   ETH <15 02/03/2024    Metabolic Disorder Labs: No results found for: HGBA1C, MPG Lab Results  Component Value Date   PROLACTIN 23.6 07/09/2024   Lab Results  Component Value Date   CHOL 169 02/23/2013   TRIG 60.0 02/23/2013   HDL 68.30 02/23/2013   CHOLHDL 2 02/23/2013   VLDL 12.0 02/23/2013   LDLCALC 89 02/23/2013   LDLCALC 101 (H) 02/18/2012    Physical Findings: AIMS:  , ,  ,  ,    CIWA:    COWS:      Psychiatric Specialty Exam:  Presentation  General Appearance:  Appropriate for Environment; Casual  Eye Contact: Fair  Speech: Normal Rate  Speech Volume: Decreased    Mood and Affect  Mood: Anxious; Depressed; Dysphoric  Affect: Depressed;  Flat   Thought Process  Thought Processes: Coherent  Orientation:Full (Time, Place and Person)  Thought Content:Illogical  Hallucinations: Denies  Ideas of Reference:None  Suicidal Thoughts: Denies  Homicidal Thoughts: Denies   Sensorium  Memory: Immediate Fair; Remote Fair; Recent Fair  Judgment: Impaired  Insight: Shallow   Executive Functions  Concentration: Fair  Attention Span: Fair  Recall: [employer] of Knowledge: Fair  Language: Fair   Psychomotor Activity  Psychomotor  Activity: No data recorded  Musculoskeletal: Strength & Muscle Tone: within normal limits Gait & Station: normal Assets  Assets: Manufacturing Systems Engineer; Desire for Improvement; Social Support    Physical Exam: Physical Exam Vitals and nursing note reviewed.    Review of Systems  Constitutional: Negative.   HENT: Negative.    Eyes: Negative.   Respiratory: Negative.    Cardiovascular: Negative.   Gastrointestinal: Negative.   Genitourinary: Negative.   Musculoskeletal: Negative.   Skin: Negative.   Neurological: Negative.   Endo/Heme/Allergies: Negative.   Psychiatric/Behavioral:  Positive for depression. The patient is nervous/anxious.    Blood pressure (!) 109/91, pulse 96, temperature (!) 97.1 F (36.2 C), resp. rate 16, height 5' 5 (1.651 m), weight 55.3 kg, last menstrual period 01/28/2015, SpO2 100%. Body mass index is 20.3 kg/m.  Diagnosis: Principal Problem:   Major depressive disorder, recurrent episode, moderate degree (HCC) Active Problems:   Severe recurrent major depression without psychotic features [facility])   Clinical Decision Making: Patient currently admitted for worsening depression, hopelessness and worsening anxiety, denies SI/HI/plan, denies hallucinations.  Patient currently is not on any psychotropic medications and is requesting stabilization.  Patient has been tried on multiple psychotropic medications with minimal  response.  Treatment Plan Summary:  Safety and Monitoring:             -- Voluntary admission to inpatient psychiatric unit for safety, stabilization and treatment             -- Daily contact with patient to assess and evaluate symptoms and progress in treatment             -- Patient's case to be discussed in multi-disciplinary team meeting             -- Observation Level: q15 minute checks             -- Vital signs:  q12 hours             -- Precautions: suicide, elopement, and assault   2. Psychiatric Diagnoses and Treatment:   Continue current medications:           Effexor XR 150 mg daily and increase Depakote ER 1000mg  Ambien  10 mg nightly for insomnia   -- The risks/benefits/side-effects/alternatives to this medication were discussed in detail with the patient and time was given for questions. The patient consents to medication trial.                -- Metabolic profile and EKG monitoring obtained while on an atypical antipsychotic (BMI: Lipid Panel: HbgA1c: QTc:)              -- Encouraged patient to participate in unit milieu and in scheduled group therapies                            3. Medical Issues Being Addressed:  No urgent medical needs identified at this time  4. Discharge Planning:   -- Social work and case management to assist with discharge planning and identification of hospital follow-up needs prior to discharge  -- Estimated LOS: 3-4 days  Allyn Foil, MD 07/16/2024, 12:48 PM

## 2024-07-16 NOTE — Progress Notes (Signed)
 Patient is a voluntary admission to [facility] with plans to discharge Thurs.  Patient mostly isolates in her room and does not participate in therapy groups. Denies SI, HI, AVH, anxiety and endorses depression.  Referral to ECT being filled out by doctor. Patient skipped dinner but ate lunch and dinner.  C/O feeling lightheaded and was given gatorade.  Will continue to monitor.

## 2024-07-16 NOTE — BH IP Treatment Plan (Signed)
 Interdisciplinary Treatment and Diagnostic Plan Update  07/16/2024 Time of Session: 3:00 PM  Carla King MRN: [id number]  Principal Diagnosis: Major depressive disorder, recurrent episode, moderate degree (HCC)  Secondary Diagnoses: Principal Problem:   Major depressive disorder, recurrent episode, moderate degree (HCC) Active Problems:   Severe recurrent major depression without psychotic features (HCC)   Current Medications:  Current Facility-Administered Medications  Medication Dose Route Frequency Provider Last Rate Last Admin   acetaminophen  (TYLENOL ) tablet 650 mg  650 mg Oral Q6H PRN Olasunkanmi, Oluwatosin, NP   650 mg at 07/10/24 2247   diphenhydrAMINE  (BENADRYL ) capsule 50 mg  50 mg Oral Q8H PRN Olasunkanmi, Oluwatosin, NP       [START ON 07/17/2024] divalproex (DEPAKOTE ER) 24 hr tablet 1,000 mg  1,000 mg Oral QHS Jadapalle, Sree, MD       feeding supplement (ENSURE PLUS HIGH PROTEIN) liquid 237 mL  237 mL Oral TID BM Jadapalle, Sree, MD   237 mL at 07/15/24 1353   hydrOXYzine  (ATARAX ) tablet 25 mg  25 mg Oral Q6H PRN Olasunkanmi, Oluwatosin, NP   25 mg at 07/16/24 0904   multivitamin with minerals tablet 1 tablet  1 tablet Oral Daily Jadapalle, Sree, MD   1 tablet at 07/16/24 0943   nicotine  (NICODERM CQ  - dosed in mg/24 hours) patch 21 mg  21 mg Transdermal Q0600 Jadapalle, Sree, MD       OLANZapine  (ZYPREXA ) injection 5 mg  5 mg Intramuscular TID PRN Olasunkanmi, Oluwatosin, NP       OLANZapine  zydis (ZYPREXA ) disintegrating tablet 5 mg  5 mg Oral TID PRN Olasunkanmi, Oluwatosin, NP       traZODone  (DESYREL ) tablet 50 mg  50 mg Oral QHS PRN Olasunkanmi, Oluwatosin, NP   50 mg at 07/09/24 2235   venlafaxine XR (EFFEXOR-XR) 24 hr capsule 150 mg  150 mg Oral Q breakfast Hampton, Tracie B, NP   150 mg at 07/16/24 9260   zolpidem  (AMBIEN ) tablet 10 mg  10 mg Oral QHS Jadapalle, Sree, MD   10 mg at 07/15/24 2121   PTA Medications: Medications Prior to Admission  Medication  Sig Dispense Refill Last Dose/Taking   amitriptyline  (ELAVIL ) 25 MG tablet Take 25 mg by mouth See admin instructions. Take 1 tablet PO every morning and 2 tablets PO at bedtime. (Patient not taking: Reported on 07/09/2024)      Cholecalciferol (VITAMIN D3 PO) Take 1 tablet by mouth daily.      clonazePAM  (KLONOPIN ) 0.5 MG tablet Take 1 tablet (0.5 mg total) by mouth 3 (three) times daily as needed (severe anxiety). (Patient not taking: Reported on 07/09/2024) 30 tablet 0    clonazePAM  (KLONOPIN ) 0.5 MG tablet Take 0.5 mg by mouth 2 (two) times daily as needed for anxiety. (Patient not taking: Reported on 07/09/2024)      cyclobenzaprine  (FLEXERIL ) 10 MG tablet Take 10 mg by mouth 3 (three) times daily as needed. (Patient not taking: Reported on 07/09/2024)      dicyclomine  (BENTYL ) 20 MG tablet Take 1 tablet (20 mg total) by mouth 4 (four) times daily -  before meals and at bedtime. (Patient not taking: Reported on 07/09/2024) 120 tablet 0    lithium  carbonate (ESKALITH ) 450 MG ER tablet Take 1 tablet (450 mg total) by mouth every 12 (twelve) hours. (Patient not taking: Reported on 07/09/2024) 60 tablet 0    MAGNESIUM  PO Take 1 tablet by mouth daily.      Multiple Vitamin (MULTIVITAMIN) tablet Take 1 tablet  by mouth daily.      paliperidone  (INVEGA ) 6 MG 24 hr tablet Take 1 tablet (6 mg total) by mouth daily. (Patient not taking: Reported on 07/09/2024) 30 tablet 0    PARoxetine  (PAXIL ) 20 MG tablet Take 20 mg by mouth 2 (two) times daily. (Patient not taking: Reported on 07/09/2024)      thiamine  (VITAMIN B-1) 100 MG tablet Take 1 tablet (100 mg total) by mouth daily. (Patient not taking: Reported on 07/09/2024) 30 tablet 0    Venlafaxine HCl (EFFEXOR XR PO) Take 1 tablet by mouth See admin instructions. Unknown strength and frequency (Patient not taking: Reported on 07/09/2024)      ziprasidone  (GEODON ) 20 MG capsule Take 20 mg by mouth 2 (two) times daily with a meal. (Patient not taking: Reported  on 07/09/2024)       Patient Stressors:    Patient Strengths:    Treatment Modalities: Medication Management, Group therapy, Case management,  1 to 1 session with clinician, Psychoeducation, Recreational therapy.   Physician Treatment Plan for Primary Diagnosis: Major depressive disorder, recurrent episode, moderate degree (HCC) Long Term Goal(s): Improvement in symptoms so as ready for discharge   Short Term Goals: Ability to identify changes in lifestyle to reduce recurrence of condition will improve Ability to verbalize feelings will improve Ability to disclose and discuss suicidal ideas Ability to demonstrate self-control will improve Ability to identify and develop effective coping behaviors will improve Ability to maintain clinical measurements within normal limits will improve  Medication Management: Evaluate patient's response, side effects, and tolerance of medication regimen.  Therapeutic Interventions: 1 to 1 sessions, Unit Group sessions and Medication administration.  Evaluation of Outcomes: Not Progressing  Physician Treatment Plan for Secondary Diagnosis: Principal Problem:   Major depressive disorder, recurrent episode, moderate degree (HCC) Active Problems:   Severe recurrent major depression without psychotic features (HCC)  Long Term Goal(s): Improvement in symptoms so as ready for discharge   Short Term Goals: Ability to identify changes in lifestyle to reduce recurrence of condition will improve Ability to verbalize feelings will improve Ability to disclose and discuss suicidal ideas Ability to demonstrate self-control will improve Ability to identify and develop effective coping behaviors will improve Ability to maintain clinical measurements within normal limits will improve     Medication Management: Evaluate patient's response, side effects, and tolerance of medication regimen.  Therapeutic Interventions: 1 to 1 sessions, Unit Group sessions and  Medication administration.  Evaluation of Outcomes: Not Progressing   RN Treatment Plan for Primary Diagnosis: Major depressive disorder, recurrent episode, moderate degree (HCC) Long Term Goal(s): Knowledge of disease and therapeutic regimen to maintain health will improve  Short Term Goals: Ability to remain free from injury will improve, Ability to verbalize frustration and anger appropriately will improve, Ability to demonstrate self-control, Ability to participate in decision making will improve, Ability to verbalize feelings will improve, Ability to disclose and discuss suicidal ideas, Ability to identify and develop effective coping behaviors will improve, and Compliance with prescribed medications will improve  Medication Management: RN will administer medications as ordered by provider, will assess and evaluate patient's response and provide education to patient for prescribed medication. RN will report any adverse and/or side effects to prescribing provider.  Therapeutic Interventions: 1 on 1 counseling sessions, Psychoeducation, Medication administration, Evaluate responses to treatment, Monitor vital signs and CBGs as ordered, Perform/monitor CIWA, COWS, AIMS and Fall Risk screenings as ordered, Perform wound care treatments as ordered.  Evaluation of Outcomes: Not Progressing  LCSW Treatment Plan for Primary Diagnosis: Major depressive disorder, recurrent episode, moderate degree (HCC) Long Term Goal(s): Safe transition to appropriate next level of care at discharge, Engage patient in therapeutic group addressing interpersonal concerns.  Short Term Goals: Engage patient in aftercare planning with referrals and resources, Increase social support, Increase ability to appropriately verbalize feelings, Increase emotional regulation, Facilitate acceptance of mental health diagnosis and concerns, Facilitate patient progression through stages of change regarding substance use diagnoses and  concerns, Identify triggers associated with mental health/substance abuse issues, and Increase skills for wellness and recovery  Therapeutic Interventions: Assess for all discharge needs, 1 to 1 time with Social worker, Explore available resources and support systems, Assess for adequacy in community support network, Educate family and significant other(s) on suicide prevention, Complete Psychosocial Assessment, Interpersonal group therapy.  Evaluation of Outcomes: Not Progressing  Progress in Treatment: Attending groups: Yes. and No. Participating in groups: Yes. and No. Taking medication as prescribed: Yes. Toleration medication: Yes. Family/Significant other contact made: No, will contact:  CSW will contact if given permission  Patient understands diagnosis: Yes. Discussing patient identified problems/goals with staff: Yes. Medical problems stabilized or resolved: Yes. Denies suicidal/homicidal ideation: Yes. Issues/concerns per patient self-inventory: No. Other: None    New problem(s) identified: No, Describe:  None identified Update 07/16/24: No changes at this time    New Short Term/Long Term Goal(s): elimination of symptoms of psychosis, medication management for mood stabilization; elimination of SI thoughts; development of comprehensive mental wellness plan. Update 07/16/24: No changes at this time    Patient Goals:  I've been trying to get some sleep, help me with my depress and anxiety medication Update 07/16/24: No changes at this time    Discharge Plan or Barriers: CSW will assist with appropriate discharge planning Update 07/16/24: No changes at this time    Reason for Continuation of Hospitalization: Anxiety Depression Medication stabilization   Estimated Length of Stay: 1 to 7 days Update 07/16/24: TBD  Last 3 [place] Suicide Severity Risk Score: Flowsheet Row Admission (Current) from 07/09/2024 in [facility] BEHAVIORAL MEDICINE Most recent reading at 07/09/2024  10:15 PM ED from 07/09/2024 in [facility] Most recent reading at 07/09/2024 11:45 AM Admission (Discharged) from 02/04/2024 in [facility] INPATIENT BEHAVIORAL MEDICINE Most recent reading at 02/04/2024  3:13 AM  C-SSRS RISK CATEGORY Low Risk No Risk No Risk    Last PHQ 2/9 Scores:     No data to display          Scribe for Treatment Team: Carla King 07/16/2024 3:32 PM

## 2024-07-16 NOTE — Progress Notes (Signed)
   07/16/24 0242  Psych Admission Type (Psych Patients Only)  Admission Status Voluntary  Psychosocial Assessment  Patient Complaints Worrying;Depression;Sadness  Eye Contact Brief  Facial Expression Sad  Affect Depressed  Speech Logical/coherent  Interaction Minimal  Motor Activity Slow  Appearance/Hygiene Unremarkable  Behavior Characteristics Cooperative  Mood Depressed  Thought Process  Coherency WDL  Content WDL  Delusions None reported or observed  Perception WDL  Hallucination None reported or observed  Judgment Impaired  Confusion WDL  Danger to Self  Current suicidal ideation? Denies  Danger to Others  Danger to Others None reported or observed

## 2024-07-16 NOTE — Group Note (Signed)
 Date:  07/16/2024 Time:  11:25 PM  Group Topic/Focus:  Wrap-Up Group:   The focus of this group is to help patients review their daily goal of treatment and discuss progress on daily workbooks.    Participation Level:  Did Not Attend  Participation Quality:     Affect:     Cognitive:     Insight: None  Engagement in Group:  None  Modes of Intervention:     Additional Comments:    Tommas CHRISTELLA Bunker 07/16/2024, 11:25 PM

## 2024-07-16 NOTE — Group Note (Signed)
 Recreation Therapy Group Note   Group Topic:Other  Group Date: 07/16/2024 Start Time: 1400 End Time: 1445 Facilitators: Celestia Jeoffrey BRAVO, LRT, CTRS Location: Dayroom  Activity Description/Intervention: Therapeutic Drumming. Patients with peers and staff were given the opportunity to engage in a leader facilitated [facility] Group Empowerment Drumming Circle with staff from the [employer], in partnership with [employer]. Teaching laboratory technician and trained [place], Norleen Mon leading with LRT observing and documenting intervention and pt response. This evidenced-based practice targets 7 areas of health and wellbeing in the human experience including: stress-reduction, exercise, self-expression, camaraderie/support, nurturing, spirituality, and music-making (leisure).    Goal Area(s) Addresses:  Patient will engage in pro-social way in music group.  Patient will follow directions of drum leader on the first prompt. Patient will demonstrate no behavioral issues during group.  Patient will identify if a reduction in stress level occurs as a result of participation in therapeutic drum circle.   Affect/Mood: N/A   Participation Level: Did not attend    Clinical Observations/Individualized Feedback: Patient did not attend group.   Plan: Continue to engage patient in RT group sessions 2-3x/week.   Jeoffrey BRAVO Celestia, LRT, CTRS 07/16/2024 4:14 PM

## 2024-07-17 DIAGNOSIS — F332 Major depressive disorder, recurrent severe without psychotic features: Secondary | ICD-10-CM | POA: Diagnosis not present

## 2024-07-17 NOTE — BHH Counselor (Signed)
 ECT/TMS referral sent on patient's behalf via email at the site's request.   CSW to continue to assess.   Bradlee Bridgers, MSW, LCSWA 07/17/2024 10:28 AM

## 2024-07-17 NOTE — Group Note (Signed)
 Recreation Therapy Group Note   Group Topic:Communication  Group Date: 07/17/2024 Start Time: 1400 End Time: 1430 Facilitators: Celestia Jeoffrey BRAVO, LRT, CTRS Location: Dayroom  Group Description: Name That Food. Pt will randomly select 1 cut paper from laminated stack of popular food images from the 1970's-1990's from LRT. Without showing the group the image they selected, pt will use descriptive words to explain what food they selected. Once the image is correctly guessed, LRT and pts will reminisce and discuss past fond memories associated with the food as well as the importance of communication.  Goal Area(s) Addressed: Patient will increase communication skills.  Patient will increase frustration tolerance skills. Patient will reminisce a fond memory in their life.     Affect/Mood: N/A   Participation Level: Did not attend    Clinical Observations/Individualized Feedback: Patient did not attend group.   Plan: Continue to engage patient in RT group sessions 2-3x/week.   Jeoffrey BRAVO Celestia, LRT, CTRS 07/17/2024 4:41 PM

## 2024-07-17 NOTE — Progress Notes (Signed)
 [facility] MD Progress Note  07/17/2024 1:08 PM Carla King  MRN:  [id number]  Carla King is a 58 year old female presenting to [facility] accompanied by her father. Pt states that she is very depressed at this time. Pt reports that she does not want to get out of bed and has been going on for 3 months. Pt reports she has not been sleeping for weeks. Pt states, I dont have SI thoughts but if I did not wake up that would be fine. 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 Patient is admitted to [facility] unit with Q15 min safety monitoring. Multidisciplinary team approach is offered. Medication management; group/milieu therapy is offered.   Subjective:  Chart reviewed, case discussed in multidisciplinary meeting, patient seen during rounds.  Patient is noted to be sleeping in her room.  Provider encouraged patient to leave her room and sit in the day area times.  Provider also educated about the inpatient policy of locking up to room during group sessions.  Patient expressed her understanding.  Patient reports ongoing chronic severe depression.  Per nursing she is displaying improvement in her appetite.  Patient reports that she came out to have some breakfast today.  She consistently denies SI/HI/plan.  She understands that she will be discharged home with referrals to TMS in the community.  TMS referral to [facility] has been made.  Past Psychiatric History: see h&P Family History: History reviewed. No pertinent family history. Social History:  Social History   Substance and Sexual Activity  Alcohol Use No     Social History   Substance and Sexual Activity  Drug Use Yes   Types: Marijuana    Social History   Socioeconomic History   Marital status: Married    Spouse name: Not on file   Number of children: Not on file   Years of education: Not on file   Highest education level: Not on file  Occupational History   Not on file  Tobacco Use    Smoking status: Never   Smokeless tobacco: Never  Vaping Use   Vaping status: Every Day  Substance and Sexual Activity   Alcohol use: No   Drug use: Yes    Types: Marijuana   Sexual activity: Yes  Other Topics Concern   Not on file  Social History Narrative   Not on file   Social Drivers of Health   Financial Resource Strain: Low Risk  (09/16/2023)   Received from [facility]   Overall Financial Resource Strain (CARDIA)    Difficulty of Paying Living Expenses: Not hard at all  Food Insecurity: No Food Insecurity (07/09/2024)   Hunger Vital Sign    Worried About Running Out of Food in the Last Year: Never true    Ran Out of Food in the Last Year: Never true  Transportation Needs: No Transportation Needs (07/09/2024)   PRAPARE - Administrator, Civil Service (Medical): No    Lack of Transportation (Non-Medical): No  Physical Activity: Not on file  Stress: Not on file  Social Connections: Socially Isolated (07/09/2024)   Social Connection and Isolation Panel    Frequency of Communication with Friends and Family: More than three times a week    Frequency of Social Gatherings with Friends and Family: Once a week    Attends Religious Services: Never    Database Administrator or Organizations: No  Attends Banker Meetings: Never    Marital Status: Divorced   Past Medical History:  Past Medical History:  Diagnosis Date   alcohol    colon ca 2011    Past Surgical History:  Procedure Laterality Date   AUGMENTATION MAMMAPLASTY Bilateral    LAPAROSCOPIC PARTIAL COLECTOMY Left Feb 2004    Current Medications: Current Facility-Administered Medications  Medication Dose Route Frequency Provider Last Rate Last Admin   acetaminophen  (TYLENOL ) tablet 650 mg  650 mg Oral Q6H PRN Olasunkanmi, Oluwatosin, NP   650 mg at 07/10/24 2247   diphenhydrAMINE  (BENADRYL ) capsule 50 mg  50 mg Oral Q8H PRN Olasunkanmi, Oluwatosin, NP       divalproex  (DEPAKOTE ER) 24 hr tablet 1,000 mg  1,000 mg Oral QHS Jaylon Boylen, MD       feeding supplement (ENSURE PLUS HIGH PROTEIN) liquid 237 mL  237 mL Oral TID BM Lavera Vandermeer, MD   237 mL at 07/17/24 0911   hydrOXYzine  (ATARAX ) tablet 25 mg  25 mg Oral Q6H PRN Olasunkanmi, Oluwatosin, NP   25 mg at 07/17/24 0827   multivitamin with minerals tablet 1 tablet  1 tablet Oral Daily Loray Akard, MD   1 tablet at 07/17/24 0827   nicotine  (NICODERM CQ  - dosed in mg/24 hours) patch 21 mg  21 mg Transdermal Q0600 Jedd Schulenburg, MD       OLANZapine  (ZYPREXA ) injection 5 mg  5 mg Intramuscular TID PRN Olasunkanmi, Oluwatosin, NP       OLANZapine  zydis (ZYPREXA ) disintegrating tablet 5 mg  5 mg Oral TID PRN Olasunkanmi, Oluwatosin, NP       traZODone  (DESYREL ) tablet 50 mg  50 mg Oral QHS PRN Olasunkanmi, Oluwatosin, NP   50 mg at 07/09/24 2235   venlafaxine XR (EFFEXOR-XR) 24 hr capsule 150 mg  150 mg Oral Q breakfast Hampton, Tracie B, NP   150 mg at 07/17/24 0827   zolpidem  (AMBIEN ) tablet 10 mg  10 mg Oral QHS Abbegayle Denault, MD   10 mg at 07/16/24 2058    Lab Results:  No results found for this or any previous visit (from the past 48 hours).   Blood Alcohol level:  Lab Results  Component Value Date   [facility] <15 07/09/2024   ETH <15 02/03/2024    Metabolic Disorder Labs: No results found for: HGBA1C, MPG Lab Results  Component Value Date   PROLACTIN 23.6 07/09/2024   Lab Results  Component Value Date   CHOL 169 02/23/2013   TRIG 60.0 02/23/2013   HDL 68.30 02/23/2013   CHOLHDL 2 02/23/2013   VLDL 12.0 02/23/2013   LDLCALC 89 02/23/2013   LDLCALC 101 (H) 02/18/2012    Physical Findings: AIMS:  , ,  ,  ,    CIWA:    COWS:      Psychiatric Specialty Exam:  Presentation  General Appearance:  Appropriate for Environment; Casual  Eye Contact: Fair  Speech: Normal Rate  Speech Volume: Decreased    Mood and Affect  Mood: Anxious; Depressed;  Dysphoric  Affect: Depressed; Flat   Thought Process  Thought Processes: Coherent  Orientation:Full (Time, Place and Person)  Thought Content:Illogical  Hallucinations: Denies  Ideas of Reference:None  Suicidal Thoughts: Denies  Homicidal Thoughts: Denies   Sensorium  Memory: Immediate Fair; Remote Fair; Recent Fair  Judgment: Impaired  Insight: Shallow   Executive Functions  Concentration: Fair  Attention Span: Fair  Recall: [employer] of Knowledge: Fair  Language: Fair  Psychomotor Activity  Psychomotor Activity: No data recorded  Musculoskeletal: Strength & Muscle Tone: within normal limits Gait & Station: normal Assets  Assets: Manufacturing Systems Engineer; Desire for Improvement; Social Support    Physical Exam: Physical Exam Vitals and nursing note reviewed.    Review of Systems  Constitutional: Negative.   HENT: Negative.    Eyes: Negative.   Respiratory: Negative.    Cardiovascular: Negative.   Gastrointestinal: Negative.   Genitourinary: Negative.   Musculoskeletal: Negative.   Skin: Negative.   Neurological: Negative.   Endo/Heme/Allergies: Negative.   Psychiatric/Behavioral:  Positive for depression. The patient is nervous/anxious.    Blood pressure 103/76, pulse 91, temperature 98.1 F (36.7 C), resp. rate 16, height 5' 5 (1.651 m), weight 53.1 kg, last menstrual period 01/28/2015, SpO2 98%. Body mass index is 19.48 kg/m.  Diagnosis: Principal Problem:   Major depressive disorder, recurrent episode, moderate degree (HCC) Active Problems:   Severe recurrent major depression without psychotic features [facility])   Clinical Decision Making: Patient currently admitted for worsening depression, hopelessness and worsening anxiety, denies SI/HI/plan, denies hallucinations.  Patient currently is not on any psychotropic medications and is requesting stabilization.  Patient has been tried on multiple psychotropic medications with  minimal response.  Treatment Plan Summary:  Safety and Monitoring:             -- Voluntary admission to inpatient psychiatric unit for safety, stabilization and treatment             -- Daily contact with patient to assess and evaluate symptoms and progress in treatment             -- Patient's case to be discussed in multi-disciplinary team meeting             -- Observation Level: q15 minute checks             -- Vital signs:  q12 hours             -- Precautions: suicide, elopement, and assault   2. Psychiatric Diagnoses and Treatment:   Continue current medications:           Effexor XR 150 mg daily and increase Depakote ER 1000mg  Ambien  10 mg nightly for insomnia   -- The risks/benefits/side-effects/alternatives to this medication were discussed in detail with the patient and time was given for questions. The patient consents to medication trial.                -- Metabolic profile and EKG monitoring obtained while on an atypical antipsychotic (BMI: Lipid Panel: HbgA1c: QTc:)              -- Encouraged patient to participate in unit milieu and in scheduled group therapies                            3. Medical Issues Being Addressed:  No urgent medical needs identified at this time  4. Discharge Planning:   -- Social work and case management to assist with discharge planning and identification of hospital follow-up needs prior to discharge  -- Estimated LOS: 3-4 days  Allyn Foil, MD 07/17/2024, 1:08 PM

## 2024-07-17 NOTE — Progress Notes (Signed)
   07/17/24 0800  Psych Admission Type (Psych Patients Only)  Admission Status Voluntary  Psychosocial Assessment  Patient Complaints Depression;Anxiety  Eye Contact Brief  Facial Expression Sad  Affect Depressed  Speech Logical/coherent  Interaction Minimal  Motor Activity Slow  Appearance/Hygiene Unremarkable  Behavior Characteristics Cooperative  Mood Depressed  Thought Process  Coherency WDL  Content WDL  Delusions None reported or observed  Perception WDL  Hallucination None reported or observed  Judgment Impaired  Confusion None  Danger to Self  Current suicidal ideation? Denies  Agreement Not to Harm Self Yes  Description of Agreement verbal  Danger to Others  Danger to Others None reported or observed

## 2024-07-17 NOTE — Group Note (Signed)
 Date:  07/17/2024 Time:  12:44 PM  Group Topic/Focus:  Overcoming Stress:   The focus of this group is to define stress and help patients assess their triggers.    Participation Level:  Minimal   Carla King 07/17/2024, 12:44 PM

## 2024-07-17 NOTE — Plan of Care (Signed)
  Problem: Activity: Goal: Interest or engagement in leisure activities will improve Outcome: Progressing   

## 2024-07-17 NOTE — Plan of Care (Signed)
  Problem: Education: Goal: Utilization of techniques to improve thought processes will improve Outcome: Progressing   Problem: Activity: Goal: Interest or engagement in leisure activities will improve Outcome: Progressing Goal: Imbalance in normal sleep/wake cycle will improve Outcome: Progressing   

## 2024-07-17 NOTE — Group Note (Signed)
 LCSW Group Therapy Note  Group Date: 07/17/2024 Start Time: 1315 End Time: 1400   Type of Therapy and Topic:  Group Therapy - How To Cope with Nervousness about Discharge   Participation Level:  Did Not Attend   Description of Group This process group involved identification of patients' feelings about discharge. Some of them are scheduled to be discharged soon, while others are new admissions, but each of them was asked to share thoughts and feelings surrounding discharge from the hospital. One common theme was that they are excited at the prospect of going home, while another was that many of them are apprehensive about sharing why they were hospitalized. Patients were given the opportunity to discuss these feelings with their peers in preparation for discharge.  Therapeutic Goals  Patient will identify their overall feelings about pending discharge. Patient will think about how they might proactively address issues that they believe will once again arise once they get home (i.e. with parents). Patients will participate in discussion about having hope for change.   Summary of Patient Progress:  X  Therapeutic Modalities Cognitive Behavioral Therapy   Carla King, LCSWA 07/17/2024  1:53 PM

## 2024-07-18 DIAGNOSIS — F331 Major depressive disorder, recurrent, moderate: Secondary | ICD-10-CM | POA: Diagnosis not present

## 2024-07-18 LAB — HEMOGLOBIN A1C
Hgb A1c MFr Bld: 4.3 % — ABNORMAL LOW (ref 4.8–5.6)
Mean Plasma Glucose: 76.71 mg/dL

## 2024-07-18 NOTE — BHH Counselor (Signed)
 CSW spoke with pt as pt's nurse reported that pt would like to speak with CSW.   CSW went to speak with pt.    Pt requests that CSW talk with provider about pt staying until Friday. Pt reports she is nervous.   CSW asked what would make her feel better about going home.   Pt reports she is unsure at this time.   Pt confirms follow-up with CSW for [facility], PCP and TMS.   CSW spoke with provider.   Provider reports that she is fine with pt discharging Friday.   Provider asks CSW if she can look into CST for pt.   CSW will look into CST per provider's request.    Lum Croft, MSW, [facility] 07/18/2024 2:07 PM

## 2024-07-18 NOTE — Progress Notes (Signed)
 [facility] MD Progress Note  07/18/2024 1:26 PM Carla King  MRN:  [id number]  [name] is a 58 year old female presenting to [facility] accompanied by her father. Pt states that she is very depressed at this time. Pt reports that she does not want to get out of bed and has been going on for 3 months. Pt reports she has not been sleeping for weeks. Pt states, I dont have SI thoughts but if I did not wake up that would be fine. 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 Patient is admitted to [facility] unit with Q15 min safety monitoring. Multidisciplinary team approach is offered. Medication management; group/milieu therapy is offered.   Subjective:  Chart reviewed, case discussed in multidisciplinary meeting, patient seen during rounds. On interview patient is noted to be resting in bed.  She informed the provider that she came out to eat her meals and participated in groups.  Staff also reports that patient is coming out of her room and participating in groups .  She reports ongoing depression with low energy and motivation.  She reports that at home she stopped cooking for herself as she feels tired all the time.  Provider encouraged her to follow-up with her primary care regarding her poor appetite and low energy.  Patient is taking her depression medications and tolerating it very well with no reported side effects.  She denies SI/HI/plan and denies auditory/visual hallucinations.  Provider and patient continue to discuss about her discharge planning  Past Psychiatric History: see h&P Family History: History reviewed. No pertinent family history. Social History:  Social History   Substance and Sexual Activity  Alcohol Use No     Social History   Substance and Sexual Activity  Drug Use Yes   Types: Marijuana    Social History   Socioeconomic History   Marital status: Married    Spouse name: Not on file   Number of children: Not on  file   Years of education: Not on file   Highest education level: Not on file  Occupational History   Not on file  Tobacco Use   Smoking status: Never   Smokeless tobacco: Never  Vaping Use   Vaping status: Every Day  Substance and Sexual Activity   Alcohol use: No   Drug use: Yes    Types: Marijuana   Sexual activity: Yes  Other Topics Concern   Not on file  Social History Narrative   Not on file   Social Drivers of Health   Financial Resource Strain: Low Risk  (09/16/2023)   Received from [facility]   Overall Financial Resource Strain (CARDIA)    Difficulty of Paying Living Expenses: Not hard at all  Food Insecurity: No Food Insecurity (07/09/2024)   Hunger Vital Sign    Worried About Running Out of Food in the Last Year: Never true    Ran Out of Food in the Last Year: Never true  Transportation Needs: No Transportation Needs (07/09/2024)   PRAPARE - Administrator, Civil Service (Medical): No    Lack of Transportation (Non-Medical): No  Physical Activity: Not on file  Stress: Not on file  Social Connections: Socially Isolated (07/09/2024)   Social Connection and Isolation Panel    Frequency of Communication with Friends and Family: More than three times a week    Frequency of Social Gatherings with Friends and Family:  Once a week    Attends Religious Services: Never    Active Member of Clubs or Organizations: No    Attends Banker Meetings: Never    Marital Status: Divorced   Past Medical History:  Past Medical History:  Diagnosis Date   alcohol    colon ca 2011    Past Surgical History:  Procedure Laterality Date   AUGMENTATION MAMMAPLASTY Bilateral    LAPAROSCOPIC PARTIAL COLECTOMY Left Feb 2004    Current Medications: Current Facility-Administered Medications  Medication Dose Route Frequency Provider Last Rate Last Admin   acetaminophen  (TYLENOL ) tablet 650 mg  650 mg Oral Q6H PRN Olasunkanmi, Oluwatosin, NP    650 mg at 07/10/24 2247   diphenhydrAMINE  (BENADRYL ) capsule 50 mg  50 mg Oral Q8H PRN Olasunkanmi, Oluwatosin, NP       divalproex (DEPAKOTE ER) 24 hr tablet 1,000 mg  1,000 mg Oral QHS Kennedy Brines, MD   1,000 mg at 07/17/24 2118   feeding supplement (ENSURE PLUS HIGH PROTEIN) liquid 237 mL  237 mL Oral TID BM Chavonne Sforza, MD   237 mL at 07/17/24 0911   hydrOXYzine  (ATARAX ) tablet 25 mg  25 mg Oral Q6H PRN Olasunkanmi, Oluwatosin, NP   25 mg at 07/18/24 0903   multivitamin with minerals tablet 1 tablet  1 tablet Oral Daily Atlantis Delong, MD   1 tablet at 07/18/24 9096   nicotine  (NICODERM CQ  - dosed in mg/24 hours) patch 21 mg  21 mg Transdermal Q0600 Maxie Debose, MD       OLANZapine  (ZYPREXA ) injection 5 mg  5 mg Intramuscular TID PRN Olasunkanmi, Oluwatosin, NP       OLANZapine  zydis (ZYPREXA ) disintegrating tablet 5 mg  5 mg Oral TID PRN Olasunkanmi, Oluwatosin, NP       traZODone  (DESYREL ) tablet 50 mg  50 mg Oral QHS PRN Olasunkanmi, Oluwatosin, NP   50 mg at 07/09/24 2235   venlafaxine XR (EFFEXOR-XR) 24 hr capsule 150 mg  150 mg Oral Q breakfast Hampton, Tracie B, NP   150 mg at 07/18/24 9096   zolpidem  (AMBIEN ) tablet 10 mg  10 mg Oral QHS Jaken Fregia, MD   10 mg at 07/17/24 2117    Lab Results:  Results for orders placed or performed during the hospital encounter of 07/09/24 (from the past 48 hours)  Hemoglobin A1c     Status: Abnormal   Collection Time: 07/18/24  7:03 AM  Result Value Ref Range   Hgb A1c MFr Bld 4.3 (L) 4.8 - 5.6 %    Comment: (NOTE) Diagnosis of Diabetes The following HbA1c ranges recommended by the American Diabetes Association (ADA) may be used as an aid in the diagnosis of diabetes mellitus.  Hemoglobin             Suggested A1C NGSP%              Diagnosis  <5.7                   Non Diabetic  5.7-6.4                Pre-Diabetic  >6.4                   Diabetic  <7.0                   Glycemic control for  adults with diabetes.     Mean Plasma Glucose 76.71 mg/dL    Comment: Performed at [facility], [address] [place], [place] [address]     Blood Alcohol level:  Lab Results  Component Value Date   [facility] <15 07/09/2024   ETH <15 02/03/2024    Metabolic Disorder Labs: Lab Results  Component Value Date   HGBA1C 4.3 (L) 07/18/2024   MPG 76.71 07/18/2024   Lab Results  Component Value Date   PROLACTIN 23.6 07/09/2024   Lab Results  Component Value Date   CHOL 169 02/23/2013   TRIG 60.0 02/23/2013   HDL 68.30 02/23/2013   CHOLHDL 2 02/23/2013   VLDL 12.0 02/23/2013   LDLCALC 89 02/23/2013   LDLCALC 101 (H) 02/18/2012    Physical Findings: AIMS:  , ,  ,  ,    CIWA:    COWS:      Psychiatric Specialty Exam:  Presentation  General Appearance:  Appropriate for Environment; Casual  Eye Contact: Fair  Speech: Normal Rate  Speech Volume: Decreased    Mood and Affect  Mood: Depressed  Affect: Appropriate   Thought Process  Thought Processes: Coherent  Orientation:Full (Time, Place and Person)  Thought Content: Coherent, linear Hallucinations: Denies  Ideas of Reference:None  Suicidal Thoughts: Denies  Homicidal Thoughts: Denies   Sensorium  Memory: Immediate Fair; Remote Fair; Recent Fair  Judgment: Impaired  Insight: Shallow   Executive Functions  Concentration: Fair  Attention Span: Fair  Recall: [employer] of Knowledge: Fair  Language: Fair   Psychomotor Activity  Psychomotor Activity: No data recorded  Musculoskeletal: Strength & Muscle Tone: within normal limits Gait & Station: normal Assets  Assets: Manufacturing Systems Engineer; Desire for Improvement; Social Support    Physical Exam: Physical Exam Vitals and nursing note reviewed.    Review of Systems  Constitutional: Negative.   HENT: Negative.    Eyes: Negative.   Respiratory: Negative.    Cardiovascular: Negative.   Gastrointestinal:  Negative.   Genitourinary: Negative.   Musculoskeletal: Negative.   Skin: Negative.   Neurological: Negative.   Endo/Heme/Allergies: Negative.   Psychiatric/Behavioral:  Positive for depression. The patient is nervous/anxious.    Blood pressure 99/67, pulse 84, temperature (!) 97.5 F (36.4 C), resp. rate 16, height 5' 5 (1.651 m), weight 53.1 kg, last menstrual period 01/28/2015, SpO2 100%. Body mass index is 19.48 kg/m.  Diagnosis: Principal Problem:   Major depressive disorder, recurrent episode, moderate degree (HCC)   Clinical Decision Making: Patient currently admitted for worsening depression, hopelessness and worsening anxiety, denies SI/HI/plan, denies hallucinations.  Patient currently is not on any psychotropic medications and is requesting stabilization.  Patient has been tried on multiple psychotropic medications with minimal response.  Treatment Plan Summary:  Safety and Monitoring:             -- Voluntary admission to inpatient psychiatric unit for safety, stabilization and treatment             -- Daily contact with patient to assess and evaluate symptoms and progress in treatment             -- Patient's case to be discussed in multi-disciplinary team meeting             -- Observation Level: q15 minute checks             -- Vital signs:  q12 hours             --  Precautions: suicide, elopement, and assault   2. Psychiatric Diagnoses and Treatment:   Continue current medications:           Effexor XR 150 mg daily and increase Depakote ER 1000mg  Ambien  10 mg nightly for insomnia   -- The risks/benefits/side-effects/alternatives to this medication were discussed in detail with the patient and time was given for questions. The patient consents to medication trial.                -- Metabolic profile and EKG monitoring obtained while on an atypical antipsychotic (BMI: Lipid Panel: HbgA1c: QTc:)              -- Encouraged patient to participate in unit milieu and in  scheduled group therapies                            3. Medical Issues Being Addressed:  No urgent medical needs identified at this time  4. Discharge Planning:   -- Social work and case management to assist with discharge planning and identification of hospital follow-up needs prior to discharge  -- Estimated LOS: 3-4 days  Allyn Foil, MD 07/18/2024, 1:26 PM

## 2024-07-18 NOTE — Group Note (Signed)
 Date:  07/18/2024 Time:  10:54 AM  Group Topic/Focus:  Building Self Esteem:   The Focus of this group is helping patients become aware of the effects of self-esteem on their lives, the things they and others do that enhance or undermine their self-esteem, seeing the relationship between their level of self-esteem and the choices they make and learning ways to enhance self-esteem.    Participation Level:  Active  Participation Quality:  Appropriate  Affect:  Appropriate  Cognitive:  Appropriate  Insight: Appropriate  Engagement in Group:  Engaged  Modes of Intervention:  Discussion  Additional Comments:  N/A  Carla King Gavel 07/18/2024, 10:54 AM

## 2024-07-18 NOTE — Progress Notes (Signed)
   07/17/24 2100  Psych Admission Type (Psych Patients Only)  Admission Status Voluntary  Psychosocial Assessment  Patient Complaints Anxiety;Depression  Eye Contact Brief  Facial Expression Sad  Affect Depressed;Anxious  Speech Logical/coherent  Interaction Minimal  Motor Activity Slow  Appearance/Hygiene Unremarkable  Behavior Characteristics Cooperative  Mood Depressed;Anxious  Thought Process  Coherency WDL  Content WDL  Delusions None reported or observed  Perception WDL  Hallucination None reported or observed  Judgment Impaired  Confusion None  Danger to Self  Current suicidal ideation? Denies  Agreement Not to Harm Self Yes  Description of Agreement verbal  Danger to Others  Danger to Others None reported or observed

## 2024-07-18 NOTE — Progress Notes (Signed)
   07/18/24 0900  Psych Admission Type (Psych Patients Only)  Admission Status Voluntary  Psychosocial Assessment  Patient Complaints Anxiety  Eye Contact Brief  Facial Expression Sad  Affect Depressed  Speech Logical/coherent  Interaction Minimal  Motor Activity Slow  Appearance/Hygiene In scrubs  Behavior Characteristics Cooperative  Mood Depressed  Thought Process  Coherency WDL  Content WDL  Delusions None reported or observed  Perception WDL  Hallucination None reported or observed  Judgment Poor  Confusion None  Danger to Self  Current suicidal ideation? Denies  Agreement Not to Harm Self Yes  Description of Agreement verbal  Danger to Others  Danger to Others None reported or observed

## 2024-07-18 NOTE — Plan of Care (Signed)
  Problem: Education: Goal: Utilization of techniques to improve thought processes will improve Outcome: Progressing   Problem: Coping: Goal: Will verbalize feelings Outcome: Progressing   Problem: Education: Goal: Utilization of techniques to improve thought processes will improve Outcome: Progressing   Problem: Activity: Goal: Interest or engagement in leisure activities will improve Outcome: Progressing

## 2024-07-18 NOTE — Plan of Care (Signed)
  Problem: Education: Goal: Utilization of techniques to improve thought processes will improve Outcome: Progressing   Problem: Coping: Goal: Will verbalize feelings Outcome: Progressing   Problem: Education: Goal: Utilization of techniques to improve thought processes will improve Outcome: Progressing   Problem: Activity: Goal: Interest or engagement in leisure activities will improve Outcome: Progressing Goal: Imbalance in normal sleep/wake cycle will improve Outcome: Progressing   Problem: Coping: Goal: Coping ability will improve Outcome: Progressing Goal: Will verbalize feelings Outcome: Progressing   Problem: Self-Concept: Goal: Will verbalize positive feelings about self Outcome: Progressing Goal: Level of anxiety will decrease Outcome: Progressing

## 2024-07-19 DIAGNOSIS — F331 Major depressive disorder, recurrent, moderate: Secondary | ICD-10-CM | POA: Diagnosis not present

## 2024-07-19 LAB — VALPROIC ACID LEVEL: Valproic Acid Lvl: 79 ug/mL (ref 50–100)

## 2024-07-19 MED ORDER — INFLUENZA VIRUS VACC SPLIT PF (FLUZONE) 0.5 ML IM SUSY
0.5000 mL | PREFILLED_SYRINGE | INTRAMUSCULAR | Status: AC
  Administered 2024-07-20: 0.5 mL via INTRAMUSCULAR
  Filled 2024-07-19: qty 0.5

## 2024-07-19 NOTE — Progress Notes (Signed)
 [facility] MD Progress Note  07/19/2024 8:22 PM Carla King  MRN:  [id number]  Hooser is a 58 year old female presenting to [facility] accompanied by her father. Pt states that she is very depressed at this time. Pt reports that she does not want to get out of bed and has been going on for 3 months. Pt reports she has not been sleeping for weeks. Pt states, I dont have SI thoughts but if I did not wake up that would be fine. 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 Patient is admitted to [facility] unit with Q15 min safety monitoring. Multidisciplinary team approach is offered. Medication management; group/milieu therapy is offered.   Subjective:  Chart reviewed, case discussed in multidisciplinary meeting, patient seen during rounds. Patient is noted to be resting in bed.  Patient today morning got upset with the nursing for logging her room during the group time.  She reports chronic depression but is able to display future oriented and denies by discussing other treatment options as outpatient including TMS.  Patient is aware that we have sent TMS referral out for [facility].  Will also be exploring TMS options at [facility] in [place].  She denies SI/HI/plan and denies auditory/visual hallucinations.  Her appetite has improved.  She reports that her father helps her and constantly checks on her to make sure she is taking her medications and he drives her for appointments. Past Psychiatric History: see h&P Family History: History reviewed. No pertinent family history. Social History:  Social History   Substance and Sexual Activity  Alcohol Use No     Social History   Substance and Sexual Activity  Drug Use Yes   Types: Marijuana    Social History   Socioeconomic History   Marital status: Married    Spouse name: Not on file   Number of children: Not on file   Years of education: Not on file   Highest education level: Not on file   Occupational History   Not on file  Tobacco Use   Smoking status: Never   Smokeless tobacco: Never  Vaping Use   Vaping status: Every Day  Substance and Sexual Activity   Alcohol use: No   Drug use: Yes    Types: Marijuana   Sexual activity: Yes  Other Topics Concern   Not on file  Social History Narrative   Not on file   Social Drivers of Health   Financial Resource Strain: Low Risk  (09/16/2023)   Received from [facility]   Overall Financial Resource Strain (CARDIA)    Difficulty of Paying Living Expenses: Not hard at all  Food Insecurity: No Food Insecurity (07/09/2024)   Hunger Vital Sign    Worried About Running Out of Food in the Last Year: Never true    Ran Out of Food in the Last Year: Never true  Transportation Needs: No Transportation Needs (07/09/2024)   PRAPARE - Administrator, Civil Service (Medical): No    Lack of Transportation (Non-Medical): No  Physical Activity: Not on file  Stress: Not on file  Social Connections: Socially Isolated (07/09/2024)   Social Connection and Isolation Panel    Frequency of Communication with Friends and Family: More than three times a week    Frequency of Social Gatherings with Friends and Family: Once a week    Attends Religious Services: Never  Active Member of Clubs or Organizations: No    Attends Banker Meetings: Never    Marital Status: Divorced   Past Medical History:  Past Medical History:  Diagnosis Date   alcohol    colon ca 2011    Past Surgical History:  Procedure Laterality Date   AUGMENTATION MAMMAPLASTY Bilateral    LAPAROSCOPIC PARTIAL COLECTOMY Left Feb 2004    Current Medications: Current Facility-Administered Medications  Medication Dose Route Frequency Provider Last Rate Last Admin   acetaminophen  (TYLENOL ) tablet 650 mg  650 mg Oral Q6H PRN Olasunkanmi, Oluwatosin, NP   650 mg at 07/10/24 2247   diphenhydrAMINE  (BENADRYL ) capsule 50 mg  50 mg Oral  Q8H PRN Olasunkanmi, Oluwatosin, NP       divalproex (DEPAKOTE ER) 24 hr tablet 1,000 mg  1,000 mg Oral QHS Deverick Pruss, MD   1,000 mg at 07/18/24 2108   feeding supplement (ENSURE PLUS HIGH PROTEIN) liquid 237 mL  237 mL Oral TID BM Bryceson Grape, MD   237 mL at 07/17/24 0911   hydrOXYzine  (ATARAX ) tablet 25 mg  25 mg Oral Q6H PRN Olasunkanmi, Oluwatosin, NP   25 mg at 07/19/24 1847   [START ON 07/20/2024] influenza vac split trivalent PF (FLUZONE) injection 0.5 mL  0.5 mL Intramuscular Tomorrow-1000 Dnasia Gauna, MD       multivitamin with minerals tablet 1 tablet  1 tablet Oral Daily Arnav Cregg, MD   1 tablet at 07/19/24 0825   nicotine  (NICODERM CQ  - dosed in mg/24 hours) patch 21 mg  21 mg Transdermal Q0600 Odyn Turko, MD       OLANZapine  (ZYPREXA ) injection 5 mg  5 mg Intramuscular TID PRN Olasunkanmi, Oluwatosin, NP       OLANZapine  zydis (ZYPREXA ) disintegrating tablet 5 mg  5 mg Oral TID PRN Olasunkanmi, Oluwatosin, NP       traZODone  (DESYREL ) tablet 50 mg  50 mg Oral QHS PRN Olasunkanmi, Oluwatosin, NP   50 mg at 07/09/24 2235   venlafaxine XR (EFFEXOR-XR) 24 hr capsule 150 mg  150 mg Oral Q breakfast Hampton, Tracie B, NP   150 mg at 07/19/24 9175   zolpidem  (AMBIEN ) tablet 10 mg  10 mg Oral QHS Aeneas Longsworth, MD   10 mg at 07/18/24 2107    Lab Results:  Results for orders placed or performed during the hospital encounter of 07/09/24 (from the past 48 hours)  Hemoglobin A1c     Status: Abnormal   Collection Time: 07/18/24  7:03 AM  Result Value Ref Range   Hgb A1c MFr Bld 4.3 (L) 4.8 - 5.6 %    Comment: (NOTE) Diagnosis of Diabetes The following HbA1c ranges recommended by the American Diabetes Association (ADA) may be used as an aid in the diagnosis of diabetes mellitus.  Hemoglobin             Suggested A1C NGSP%              Diagnosis  <5.7                   Non Diabetic  5.7-6.4                Pre-Diabetic  >6.4                   Diabetic  <7.0                    Glycemic control for  adults with diabetes.     Mean Plasma Glucose 76.71 mg/dL    Comment: Performed at [facility], [address] [place], [place] [address]  Valproic acid level     Status: None   Collection Time: 07/19/24 11:17 AM  Result Value Ref Range   Valproic Acid Lvl 79 50 - 100 ug/mL    Comment: Performed at [facility], [address] [place], [place] [address]     Blood Alcohol level:  Lab Results  Component Value Date   [facility] <15 07/09/2024   ETH <15 02/03/2024    Metabolic Disorder Labs: Lab Results  Component Value Date   HGBA1C 4.3 (L) 07/18/2024   MPG 76.71 07/18/2024   Lab Results  Component Value Date   PROLACTIN 23.6 07/09/2024   Lab Results  Component Value Date   CHOL 169 02/23/2013   TRIG 60.0 02/23/2013   HDL 68.30 02/23/2013   CHOLHDL 2 02/23/2013   VLDL 12.0 02/23/2013   LDLCALC 89 02/23/2013   LDLCALC 101 (H) 02/18/2012    Physical Findings: AIMS:  , ,  ,  ,    CIWA:    COWS:      Psychiatric Specialty Exam:  Presentation  General Appearance:  Appropriate for Environment; Casual  Eye Contact: Fair  Speech: Normal Rate  Speech Volume: Decreased    Mood and Affect  Mood: Depressed  Affect: Appropriate   Thought Process  Thought Processes: Coherent  Orientation:Full (Time, Place and Person)  Thought Content: Coherent, linear Hallucinations: Denies  Ideas of Reference:None  Suicidal Thoughts: Denies  Homicidal Thoughts: Denies   Sensorium  Memory: Immediate Fair; Remote Fair; Recent Fair  Judgment: Improving  Insight: Improving   Executive Functions  Concentration: Fair  Attention Span: Fair  Recall: [employer] of Knowledge: Fair  Language: Fair   Psychomotor Activity  Psychomotor Activity: No data recorded  Musculoskeletal: Strength & Muscle Tone: within normal limits Gait & Station: normal Assets   Assets: Manufacturing Systems Engineer; Desire for Improvement; Social Support    Physical Exam: Physical Exam Vitals and nursing note reviewed.    Review of Systems  Constitutional: Negative.   HENT: Negative.    Eyes: Negative.   Respiratory: Negative.    Cardiovascular: Negative.   Gastrointestinal: Negative.   Genitourinary: Negative.   Musculoskeletal: Negative.   Skin: Negative.   Neurological: Negative.   Endo/Heme/Allergies: Negative.   Psychiatric/Behavioral:  Positive for depression. The patient is nervous/anxious.    Blood pressure (!) 89/64, pulse 84, temperature (!) 97.2 F (36.2 C), resp. rate 16, height 5' 5 (1.651 m), weight 53.1 kg, last menstrual period 01/28/2015, SpO2 98%. Body mass index is 19.48 kg/m.  Diagnosis: Principal Problem:   Major depressive disorder, recurrent episode, moderate degree (HCC)   Clinical Decision Making: Patient currently admitted for worsening depression, hopelessness and worsening anxiety, denies SI/HI/plan, denies hallucinations.  Patient currently is not on any psychotropic medications and is requesting stabilization.  Patient has been tried on multiple psychotropic medications with minimal response.  Treatment Plan Summary:  Safety and Monitoring:             -- Voluntary admission to inpatient psychiatric unit for safety, stabilization and treatment             -- Daily contact with patient to assess and evaluate symptoms and progress in treatment             -- Patient's case to be discussed in multi-disciplinary team meeting             --  Observation Level: q15 minute checks             -- Vital signs:  q12 hours             -- Precautions: suicide, elopement, and assault   2. Psychiatric Diagnoses and Treatment:   Continue current medications:           Effexor XR 150 mg daily and  Depakote ER 1000mg  Ambien  10 mg nightly for insomnia   -- The risks/benefits/side-effects/alternatives to this medication were discussed in  detail with the patient and time was given for questions. The patient consents to medication trial.                -- Metabolic profile and EKG monitoring obtained while on an atypical antipsychotic (BMI: Lipid Panel: HbgA1c: QTc:)              -- Encouraged patient to participate in unit milieu and in scheduled group therapies                            3. Medical Issues Being Addressed:  No urgent medical needs identified at this time  4. Discharge Planning:   -- Social work and case management to assist with discharge planning and identification of hospital follow-up needs prior to discharge  -- Estimated LOS: 3-4 days  Mansa Willers, MD 07/19/2024, 8:22 PM

## 2024-07-19 NOTE — Group Note (Signed)
 [facility] LCSW Group Therapy Note   Group Date: 07/19/2024 Start Time: 1315 End Time: 1345   Type of Therapy/Topic:  Group Therapy:  Emotion Regulation  Participation Level:  Minimal   Mood:  Description of Group:    The purpose of this group is to assist patients in learning to regulate negative emotions and experience positive emotions. Patients will be guided to discuss ways in which they have been vulnerable to their negative emotions. These vulnerabilities will be juxtaposed with experiences of positive emotions or situations, and patients challenged to use positive emotions to combat negative ones. Special emphasis will be placed on coping with negative emotions in conflict situations, and patients will process healthy conflict resolution skills.  Therapeutic Goals: Patient will identify two positive emotions or experiences to reflect on in order to balance out negative emotions:  Patient will label two or more emotions that they find the most difficult to experience:  Patient will be able to demonstrate positive conflict resolution skills through discussion or role plays:   Summary of Patient Progress:   Pt was active and appropriate throughout group.     Therapeutic Modalities:   Cognitive Behavioral Therapy Feelings Identification Dialectical Behavioral Therapy   Lum [facility], LCSWA

## 2024-07-19 NOTE — BHH Counselor (Signed)
 CST referral sent to [facility] on patient's behalf.   CSW touched base with [facility]'s ACTT director, TJ who reported that the patient will have to complete her initial appointment with [facility] and then add herself to the wait list for CST.   CSW touched base with [facility] who reported that with the patient's current Medicaid Pending status, she may not be appropriate for services at this time.   CSW touched base with Vernell Gully, financial navigator who reported that the patient's medicaid application has been submitted and should go into effect 08/27/24.   This has been communicated to team.   Alveta Kerns, MSW, LCSWA 07/19/2024 3:57 PM   CSW to continue to assess.

## 2024-07-19 NOTE — Group Note (Signed)
 Date:  07/19/2024 Time:  11:21 AM  Group Topic/Focus:  MOVEMENT THERAPY    Participation Level:  Minimal  Participation Quality:  Inattentive  Affect:  Flat  Cognitive:  none  Insight: None  Engagement in Group:  Limited  Modes of Intervention:  Activity  Additional Comments:  Patient was only in group because of lock out of room  [name] Bias 07/19/2024, 11:21 AM

## 2024-07-19 NOTE — Progress Notes (Signed)
   07/19/24 0200  Psych Admission Type (Psych Patients Only)  Admission Status Voluntary  Psychosocial Assessment  Patient Complaints Anxiety;Appetite decrease;Depression  Eye Contact Brief  Facial Expression Sad  Affect Depressed  Speech Logical/coherent  Interaction Minimal  Motor Activity Slow  Appearance/Hygiene In scrubs  Behavior Characteristics Cooperative  Mood Depressed  Thought Process  Coherency WDL  Content WDL  Delusions None reported or observed  Perception WDL  Hallucination None reported or observed  Judgment Poor  Confusion None  Danger to Self  Current suicidal ideation? Denies  Agreement Not to Harm Self Yes  Description of Agreement verbal  Danger to Others  Danger to Others None reported or observed

## 2024-07-19 NOTE — [facility] Suicide Risk Assessment (Signed)
 [facility] Discharge Suicide Risk Assessment   Principal Problem: Major depressive disorder, recurrent episode, moderate degree (HCC) Discharge Diagnoses: Principal Problem:   Major depressive disorder, recurrent episode, moderate degree (HCC)   Total Time spent with patient: 30 minutes  Musculoskeletal: Strength & Muscle Tone: within normal limits Gait & Station: normal Patient leans: N/A  Psychiatric Specialty Exam  Presentation  General Appearance:  Appropriate for Environment  Eye Contact: Fair  Speech: Clear and Coherent  Speech Volume: Normal  Handedness: Right   Mood and Affect  Mood: Euthymic  Duration of Depression Symptoms: Greater than two weeks  Affect: Appropriate   Thought Process  Thought Processes: Coherent  Descriptions of Associations:Intact  Orientation:Full (Time, Place and Person)  Thought Content:Logical  History of Schizophrenia/Schizoaffective disorder:No  Duration of Psychotic Symptoms:Greater than six months  Hallucinations:Hallucinations: None  Ideas of Reference:None  Suicidal Thoughts:Suicidal Thoughts: No  Homicidal Thoughts:Homicidal Thoughts: No   Sensorium  Memory: Immediate Fair; Remote Fair  Judgment: Fair  Insight: Fair   Art Therapist  Concentration: Fair  Attention Span: Fair  Recall: [employer] of Knowledge: Fair  Language: Fair   Psychomotor Activity  Psychomotor Activity: Psychomotor Activity: Normal   Assets  Assets: Communication Skills; Desire for Improvement   Sleep  Sleep: Sleep: Fair  Estimated Sleeping Duration (Last 24 Hours): 9.25-11.25 hours (Due to Daylight Saving Time, the durations displayed may not accurately represent documentation during the time change interval)  Physical Exam: Physical Exam Vitals and nursing note reviewed.    ROS Blood pressure (!) 89/64, pulse 84, temperature (!) 97.2 F (36.2 C), resp. rate 16, height 5' 5 (1.651 m),  weight 53.1 kg, last menstrual period 01/28/2015, SpO2 98%. Body mass index is 19.48 kg/m.  Mental Status Per Nursing Assessment::   On Admission:  NA  Demographic Factors:  Caucasian  Loss Factors: Decrease in vocational status  Historical Factors: NA  Risk Reduction Factors:   Living with another person, especially a relative, Positive social support, Positive therapeutic relationship, and Positive coping skills or problem solving skills  Continued Clinical Symptoms:  Depression:   Insomnia  Cognitive Features That Contribute To Risk:  None    Suicide Risk:  Minimal: No identifiable suicidal ideation.  Patients presenting with no risk factors but with morbid ruminations; may be classified as minimal risk based on the severity of the depressive symptoms   Follow-up Information     [facility] Follow up on 07/23/2024.   Why: Your appointment is scheduled for 07/23/24 at 10:00 AM. I attempted to get your provider changed per our discussion. According to [facility] in order to swtich psychiatrist it has to be initiated by the patient. Contact information: [address] [place] [address] [phone]         [facility] Follow up on 08/21/2024.   Why: Your appointment 08/21/24 at 9:20 AM. Please bring your insurance card, ID, and list of medications. At this appointment you should be able to speak with your provider about a referral being made to have a sleep study done. Contact information: [address] [place],  [place]  [address]        [facility]. Call.   Why: An ECT/TMS Referral has been sent on your behalf. Please follow up reguarding this as soon as possible. Contact information: [address] [place], [place] [address]  Phone: [phone] Fax: [phone]  Plan Of Care/Follow-up recommendations:  Activity:  as  tolerated  Allyn Foil, MD 07/19/2024, 8:28 PM

## 2024-07-19 NOTE — Progress Notes (Signed)
 Patient is a voluntary admission to [facility] with expected discharge tomorrow afternoon. Patient was upset at the mandatory lock out - explained it was d/t her not participating in group or lunches.  Dr JINNY' did state later that the lock out is only at these times so the door was unlocked for patient between theses times.  Patient denies SI, HI, AVH and endorses anxiety 8/10 and depression 9/10. Patient does not interact with peers and minimally with staff.  Will continue to monitor.

## 2024-07-19 NOTE — Plan of Care (Signed)
   Problem: Coping: Goal: Coping ability will improve Outcome: Progressing Goal: Will verbalize feelings Outcome: Progressing

## 2024-07-20 DIAGNOSIS — F331 Major depressive disorder, recurrent, moderate: Secondary | ICD-10-CM | POA: Diagnosis not present

## 2024-07-20 MED ORDER — VENLAFAXINE HCL ER 150 MG PO CP24: 150.0000 mg | ORAL_CAPSULE | Freq: Every day | ORAL | 0 refills | Status: DC

## 2024-07-20 MED ORDER — ZOLPIDEM TARTRATE 10 MG PO TABS: 10.0000 mg | ORAL_TABLET | Freq: Every day | ORAL | 0 refills | Status: DC

## 2024-07-20 MED ORDER — NICOTINE 21 MG/24HR TD PT24: 21.0000 mg | MEDICATED_PATCH | Freq: Every day | TRANSDERMAL | 0 refills | Status: DC

## 2024-07-20 MED ORDER — ADULT MULTIVITAMIN W/MINERALS CH: 1.0000 | ORAL_TABLET | Freq: Every day | ORAL | 0 refills | Status: AC

## 2024-07-20 MED ORDER — DIVALPROEX SODIUM ER 500 MG PO TB24: 1000.0000 mg | ORAL_TABLET | Freq: Every day | ORAL | 0 refills | Status: DC

## 2024-07-20 NOTE — Group Note (Signed)
 Date:  07/20/2024 Time:  1:20 PM  Group Topic/Focus:  Managing Feelings:   The focus of this group is to identify what feelings patients have difficulty handling and develop a plan to handle them in a healthier way upon discharge.    Participation Level:  Did Not Attend   Maglione,Costa Jha E 07/20/2024, 1:20 PM

## 2024-07-20 NOTE — Plan of Care (Signed)
 Patient is scheduled for discharge this evening with her father to pick her up.  Patient's discharge paperwork completed and belongings gathered awaiting discharge.

## 2024-07-20 NOTE — BHH Counselor (Signed)
 CSW contacted [facility] to inquire about CST.   [facility] took pt's information to add to waitlist as CST is currently full.   CSW informed care team   Lum Croft, MSW, [facility] 07/20/2024 9:18 AM

## 2024-07-20 NOTE — BHH Counselor (Signed)
 CSW sent referral to [facility], [email or web address] at [facility] per provider's and pt's request.    Lum Croft, MSW, [facility] 07/20/2024 2:42 PM

## 2024-07-20 NOTE — Group Note (Signed)
 Recreation Therapy Group Note   Group Topic:Healthy Support Systems  Group Date: 07/20/2024 Start Time: 1400 End Time: 1450 Facilitators: Celestia Jeoffrey BRAVO, LRT, CTRS Location: Courtyard  Group Description: Emotional Check in. Patient sat and talked with LRT about how they are doing and whatever else is on their mind. LRT provided active listening, reassurance and encouragement. Pts were given the opportunity to listen to music or color mandalas while they talk.    Goal Area(s) Addressed: Patient will engage in conversation with LRT. Patient will communicate their wants, needs, or questions.  Patient will practice a new coping skill of "talking to someone".   Affect/Mood: N/A   Participation Level: Did not attend    Clinical Observations/Individualized Feedback: Patient did not attend group.   Plan: Continue to engage patient in RT group sessions 2-3x/week.   Jeoffrey BRAVO Celestia, LRT, CTRS 07/20/2024 4:34 PM

## 2024-07-20 NOTE — BHH Counselor (Signed)
 CSW and provider contacted pt's father to discuss pt's discharge plan.   Provider gave clinical updates on pt.   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 Pt's father agreeable and verbalized understanding

## 2024-07-20 NOTE — Plan of Care (Signed)
   Problem: Coping: Goal: Coping ability will improve Outcome: Progressing

## 2024-07-20 NOTE — Group Note (Signed)
 Physical/Occupational Therapy Group Note  Group Topic: UE Therex   Group Date: 07/20/2024 Start Time: 1305 End Time: 1330 Facilitators: Lavonda Therisa CROME, OT   Group Description: Group instructed in series of upper extremities exercises, aimed to promote strength, flexibility, range of motion and functional endurance.  Patients provided cuing for proper mechanics and proper pace of exercise; exercises adjusted as necessary for individualized patient needs.  Patient also engaged in cognitive components throughout session, working to integrate attention to task, command following, turn-taking and appropriate social interaction throughout session.  Allowed to ask questions as appropriate, and encouraged to identify specific exercises that they could complete independently outside of group sessions.   Therapeutic Goal(s): Demonstrate appropriate performance of upper extremity exercises to promote strength, flexibility, range of motion and functional endurance Identify 2-3 specific upper extremity exercises to complete as home exercise program outside of group session   Individual Participation: Cody attended group. She asked how long group would take but did participate throughout, sometimes looking ahead to complete exercises prior to group moving on to them.   Participation Level: Moderate   Participation Quality: Independent   Behavior: Calm and Cooperative   Speech/Thought Process: Directed   Affect/Mood: Flat   Insight: Good   Judgement: Good   Modes of Intervention: Activity and Education  Patient Response to Interventions:  Receptive   Plan: Continue to engage patient in PT/OT groups 1 - 2x/week.  07/20/2024  Therisa CROME Lavonda, OT   Therisa Lavonda, OTD OTR/L  07/20/24, 3:26 PM

## 2024-07-20 NOTE — BHH Counselor (Signed)
 CSW contacted [facility]  with pt to have pt scheduled.   Pt scheduled for 07/31/24 at 9:00 AM.   Pt agreeable to appointment time.  Lum Croft, MSW, [facility] 07/20/2024 9:28 AM

## 2024-07-20 NOTE — Progress Notes (Signed)
 Patient escorted by [facility] to medical mall for discharge with discharge paperwork, and wearing the clothes she came in with no other belongings.

## 2024-07-20 NOTE — Progress Notes (Signed)
  [facility] Adult Case Management Discharge Plan :  Will you be returning to the same living situation after discharge:  Yes,  pt will return home  At discharge, do you have transportation home?: Yes,  pt's dad will pick her up  Do you have the ability to pay for your medications: Yes,   MEDICAID PENDING / MEDICAID PENDING  Release of information consent forms completed and in the chart;  Patient's signature needed at discharge.  Patient to Follow up at:  Follow-up Information     [facility] Follow up on 07/23/2024.   Why: Your appointment is scheduled for 07/23/24 at 10:00 AM. I attempted to get your provider changed per our discussion. According to [facility] in order to swtich psychiatrist it has to be initiated by the patient. Contact information: [address] [place] [address] [phone]         [facility] Follow up on 08/21/2024.   Why: Primary Care appointment 08/21/24 at 9:20 AM. Please bring your insurance card, ID, and list of medications. At this appointment you should be able to speak with your provider about a referral being made to have a sleep study done. Contact information: [address] [place],  [place]  [address]        [facility]. Call.   Why: An ECT/TMS Referral has been sent on your behalf. Please follow up reguarding this as soon as possible. Contact information: [address], [address] [place], [place] [address]  Phone: [phone] Fax: [phone]        [facility]. Call on 07/31/2024.   Why: You have a VIRTUAL appointment scheduled for 07/31/24 for initial consultation for TMS. Please ensure that you have your phone nearby as they will be calling you. Contact information: [phone]        [facility] Follow up.   Why: A referral has been sent on your behalf for TMS to [facility]. Please follow-up on this referral following your discharge. Contact information: [address] [place] [address] [phone]                 Next level of care provider has access to [facility] Link:no  Safety Planning and Suicide Prevention discussed: Yes,  SPE gone over with pt      Has patient been referred to the Quitline?: Patient does not use tobacco/nicotine  products  Patient has been referred for addiction treatment: No known substance use disorder.  [address] LCSWA 07/20/2024, 2:43 PM

## 2024-07-22 NOTE — Discharge Summary (Signed)
 Physician Discharge Summary Note  Patient:  Carla King is an 58 y.o., female MRN:  [id number] DOB:  28-Jun-1966 Patient phone:  [phone] (home)  Patient address:   [address] [place] [facility] [address],   Total time spent: 40 min Date of Admission:  07/09/2024 Date of Discharge: 07/20/24  Reason for Admission:  Carla King is a 57 year old female presenting to [facility] accompanied by her father. Pt states that she is very depressed at this time. Pt reports that she does not want to get out of bed and has been going on for 3 months. Pt reports she has not been sleeping for weeks. Pt states, I dont have SI thoughts but if I did not wake up that would be fine. 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 Patient is admitted to [facility] unit with Q15 min safety monitoring. Multidisciplinary team approach is offered. Medication management; group/milieu therapy is offered.   Principal Problem: Major depressive disorder, recurrent episode, moderate degree (HCC) Discharge Diagnoses: Principal Problem:   Major depressive disorder, recurrent episode, moderate degree (HCC)   Past Psychiatric History: see h&p  Family Psychiatric  History: see h&p Social History:  Social History   Substance and Sexual Activity  Alcohol Use No     Social History   Substance and Sexual Activity  Drug Use Yes   Types: Marijuana    Social History   Socioeconomic History   Marital status: Married    Spouse name: Not on file   Number of children: Not on file   Years of education: Not on file   Highest education level: Not on file  Occupational History   Not on file  Tobacco Use   Smoking status: Never   Smokeless tobacco: Never  Vaping Use   Vaping status: Every Day  Substance and Sexual Activity   Alcohol use: No   Drug use: Yes    Types: Marijuana   Sexual activity: Yes  Other Topics Concern   Not on file  Social History Narrative    Not on file   Social Drivers of Health   Financial Resource Strain: Low Risk  (09/16/2023)   Received from [facility]   Overall Financial Resource Strain (CARDIA)    Difficulty of Paying Living Expenses: Not hard at all  Food Insecurity: No Food Insecurity (07/09/2024)   Hunger Vital Sign    Worried About Running Out of Food in the Last Year: Never true    Ran Out of Food in the Last Year: Never true  Transportation Needs: No Transportation Needs (07/09/2024)   PRAPARE - Administrator, Civil Service (Medical): No    Lack of Transportation (Non-Medical): No  Physical Activity: Not on file  Stress: Not on file  Social Connections: Socially Isolated (07/09/2024)   Social Connection and Isolation Panel    Frequency of Communication with Friends and Family: More than three times a week    Frequency of Social Gatherings with Friends and Family: Once a week    Attends Religious Services: Never    Database Administrator or Organizations: No    Attends Engineer, Structural: Never    Marital Status: Divorced   Past Medical History:  Past Medical History:  Diagnosis Date   alcohol    colon ca 2011    Past Surgical History:  Procedure Laterality Date   AUGMENTATION MAMMAPLASTY Bilateral  LAPAROSCOPIC PARTIAL COLECTOMY Left Feb 2004   Family History: History reviewed. No pertinent family history.  Hospital Course:  Carla King is a 58 year old female presenting to [facility] accompanied by her father. Pt states that she is very depressed at this time. Pt reports that she does not want to get out of bed and has been going on for 3 months. Pt reports she has not been sleeping for weeks. Pt states, I dont have SI thoughts but if I did not wake up that would be fine. 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 Patient is admitted to [facility] unit with Q15 min safety monitoring. Multidisciplinary team  approach is offered. Medication management; group/milieu therapy is offered.  Detailed risk assessment is complete based on clinical exam and individual risk factors and acute suicide risk is low and acute violence risk is low.     On admission patient was started on Effexor XR and dosage was optimized to 150 mg daily, Depakote ER 1000 mg for mood stabilization.  Patient consistently denied SI/HI/plan.  She reports chronic insomnia as a trigger for her depression.  Patient responded well to Ambien  10 mg nightly.  Patient displayed poor motivation and is noted to be sleeping in her room most of the day.  Her room was locked up during groups which encouraged patient to attend the groups and was noted to be more visible on the unit.  She maintained safe behaviors throughout her admission and consistently denied SI/HI throughout the admission.  Patient, provider and her family discussed treatment resistant depression and the need to pursue TMS or ECT as outpatient.  Referral to [facility] TMS has been sent and also referral for TMS in [place] location has been sent.  Patient's father is her main support and education was provided to patient and her family about following up for TMS appointments.  On the day of discharge patient consistently denies SI/HI/plan and denies hallucinations.  She was able to have most of her meals on the unit and attended the groups with no problems. Currently, all modifiable risk of harm to self/harm to others have been addressed and patient is no longer appropriate for the acute inpatient setting and is able to continue treatment for mental health needs in the community with the supports as indicated below.  Patient is educated and verbalized understanding of discharge plan of care including medications, follow-up appointments, mental health resources and further crisis services in the community.  He is instructed to call 911 or present to the nearest emergency room should he experience any  decompensation in mood, disturbance of bowel or return of suicidal/homicidal ideations.  Patient verbalizes understanding of this education and agrees to this plan of care  Physical Findings: AIMS:  , ,  ,  ,    CIWA:    COWS:        Psychiatric Specialty Exam:  Presentation  General Appearance:  Appropriate for Environment  Eye Contact: Fair  Speech: Clear and Coherent  Speech Volume: Normal    Mood and Affect  Mood: Euthymic  Affect: Appropriate   Thought Process  Thought Processes: Coherent  Descriptions of Associations:Intact  Orientation:Full (Time, Place and Person)  Thought Content:Logical  Hallucinations:No data recorded Ideas of Reference:None  Suicidal Thoughts:No data recorded Homicidal Thoughts:No data recorded  Sensorium  Memory: Immediate Fair; Remote Fair  Judgment: Fair  Insight: Fair   Art Therapist  Concentration: Fair  Attention Span: Fair  Recall: Dotti Abe of Knowledge: Fair  Language: Fair   Psychomotor Activity  Psychomotor Activity:No data recorded Musculoskeletal: Strength & Muscle Tone: within normal limits Gait & Station: normal Assets  Assets: Manufacturing Systems Engineer; Desire for Improvement   Sleep  Sleep:No data recorded   Physical Exam: Physical Exam Vitals and nursing note reviewed.    ROS Blood pressure 93/70, pulse 86, temperature 98.2 F (36.8 C), resp. rate 16, height 5' 5 (1.651 m), weight 53.1 kg, last menstrual period 01/28/2015, SpO2 99%. Body mass index is 19.48 kg/m.   Social History   Tobacco Use  Smoking Status Never  Smokeless Tobacco Never   Tobacco Cessation:  N/A, patient does not currently use tobacco products   Blood Alcohol level:  Lab Results  Component Value Date   [facility] <15 07/09/2024   ETH <15 02/03/2024    Metabolic Disorder Labs:  Lab Results  Component Value Date   HGBA1C 4.3 (L) 07/18/2024   MPG 76.71 07/18/2024   Lab Results   Component Value Date   PROLACTIN 23.6 07/09/2024   Lab Results  Component Value Date   CHOL 169 02/23/2013   TRIG 60.0 02/23/2013   HDL 68.30 02/23/2013   CHOLHDL 2 02/23/2013   VLDL 12.0 02/23/2013   LDLCALC 89 02/23/2013   LDLCALC 101 (H) 02/18/2012    See Psychiatric Specialty Exam and Suicide Risk Assessment completed by Attending Physician prior to discharge.  Discharge destination:  Home  Is patient on multiple antipsychotic therapies at discharge:  No   Has Patient had three or more failed trials of antipsychotic monotherapy by history:  No  Recommended Plan for Multiple Antipsychotic Therapies: NA   Allergies as of 07/20/2024       Reactions   Droperidol Other (See Comments)   Hallucinations   Magnesium -containing Compounds Other (See Comments)   **IV ONLY** TABLETS OKAY** Hallucinations, pain at injection site, makes me go crazy        Medication List     STOP taking these medications    amitriptyline  25 MG tablet Commonly known as: ELAVIL    clonazePAM  0.5 MG tablet Commonly known as: KLONOPIN    cyclobenzaprine  10 MG tablet Commonly known as: FLEXERIL    dicyclomine  20 MG tablet Commonly known as: BENTYL    lithium  carbonate 450 MG ER tablet Commonly known as: ESKALITH    MAGNESIUM  PO   paliperidone  6 MG 24 hr tablet Commonly known as: INVEGA    PARoxetine  20 MG tablet Commonly known as: PAXIL    thiamine  100 MG tablet Commonly known as: Vitamin B-1   VITAMIN D3 PO   ziprasidone  20 MG capsule Commonly known as: GEODON        TAKE these medications      Indication  divalproex 500 MG 24 hr tablet Commonly known as: DEPAKOTE ER Take 2 tablets (1,000 mg total) by mouth at bedtime.    multivitamin with minerals Tabs tablet Take 1 tablet by mouth daily.    nicotine  21 mg/24hr patch Commonly known as: NICODERM CQ  - dosed in mg/24 hours Place 1 patch (21 mg total) onto the skin daily at 6 (six) AM.    venlafaxine XR 150 MG 24 hr  capsule Commonly known as: EFFEXOR-XR Take 1 capsule (150 mg total) by mouth daily with breakfast. What changed:  medication strength how much to take when to take this additional instructions    zolpidem  10 MG tablet Commonly known as: AMBIEN  Take 1 tablet (10 mg total) by mouth at bedtime.  Follow-up Information     [facility] Follow up on 07/23/2024.   Why: Your appointment is scheduled for 07/23/24 at 10:00 AM. I attempted to get your provider changed per our discussion. According to [facility] in order to swtich psychiatrist it has to be initiated by the patient. Contact information: [address] [place] [address] [phone]         [facility] Follow up on 08/21/2024.   Why: Primary Care appointment 08/21/24 at 9:20 AM. Please bring your insurance card, ID, and list of medications. At this appointment you should be able to speak with your provider about a referral being made to have a sleep study done. Contact information: [address] [place],  [place]  [address]        [facility]. Call.   Why: An ECT/TMS Referral has been sent on your behalf. Please follow up reguarding this as soon as possible. Contact information: [address] [place] [address] [place], [place] [address]  Phone: [phone] Fax: [phone]        [facility]. Call on 07/31/2024.   Why: You have a VIRTUAL appointment scheduled for 07/31/24 for initial consultation for TMS. Please ensure that you have your phone nearby as they will be calling you. Contact information: [phone]        [facility] Follow up.   Why: A referral has been sent on your behalf for TMS to [facility]. Please follow-up on this referral following your discharge. Contact information: [address] [place] [address] [phone]                 Follow-up recommendations:  Activity:  as tolerated    Signed: Chiniqua Kilcrease, MD 07/22/2024, 10:39 PM

## 2024-07-23 ENCOUNTER — Telehealth (HOSPITAL_COMMUNITY): Payer: Self-pay

## 2024-07-23 NOTE — Telephone Encounter (Signed)
 Incoming email sent on 07/20/24 at 4:30pm from [facility], [place]. Email contained pt information (printed chart attached) and requested consideration of referral for TMS. [employer] Coordinator reviewed chart and responded to SW on 07/23/24 via email. Coordinator relayed that pt was not currently appropriate for tx due to contraindicating diagnosis of BPD, SUD history, recent [facility] admission, and no trial of psychotherapy. Coordinator provided solutions for treatment team if information was not accurate, and to let them know if any additional questions are had.

## 2024-08-21 ENCOUNTER — Ambulatory Visit: Payer: Self-pay | Admitting: Physician Assistant

## 2024-08-23 ENCOUNTER — Encounter: Payer: Self-pay | Admitting: Physician Assistant

## 2024-08-23 ENCOUNTER — Ambulatory Visit: Admitting: Physician Assistant

## 2024-08-23 ENCOUNTER — Ambulatory Visit (INDEPENDENT_AMBULATORY_CARE_PROVIDER_SITE_OTHER): Admitting: Physician Assistant

## 2024-08-23 VITALS — BP 96/70 | HR 73 | Resp 14 | Ht 64.0 in | Wt 124.3 lb

## 2024-08-23 DIAGNOSIS — Z85038 Personal history of other malignant neoplasm of large intestine: Secondary | ICD-10-CM | POA: Diagnosis not present

## 2024-08-23 DIAGNOSIS — F1210 Cannabis abuse, uncomplicated: Secondary | ICD-10-CM

## 2024-08-23 DIAGNOSIS — F331 Major depressive disorder, recurrent, moderate: Secondary | ICD-10-CM

## 2024-08-23 DIAGNOSIS — F418 Other specified anxiety disorders: Secondary | ICD-10-CM | POA: Diagnosis not present

## 2024-08-23 DIAGNOSIS — F99 Mental disorder, not otherwise specified: Secondary | ICD-10-CM | POA: Diagnosis not present

## 2024-08-23 DIAGNOSIS — F5105 Insomnia due to other mental disorder: Secondary | ICD-10-CM | POA: Diagnosis not present

## 2024-08-23 DIAGNOSIS — F1091 Alcohol use, unspecified, in remission: Secondary | ICD-10-CM | POA: Diagnosis not present

## 2024-08-23 MED ORDER — VENLAFAXINE HCL ER 150 MG PO CP24: 150.0000 mg | ORAL_CAPSULE | Freq: Every day | ORAL | 0 refills | Status: DC

## 2024-08-23 MED ORDER — DIVALPROEX SODIUM ER 500 MG PO TB24: 1000.0000 mg | ORAL_TABLET | Freq: Every day | ORAL | 0 refills | Status: DC

## 2024-08-23 MED ORDER — ZOLPIDEM TARTRATE 10 MG PO TABS: 10.0000 mg | ORAL_TABLET | Freq: Every day | ORAL | 0 refills | Status: DC

## 2024-08-24 LAB — COMPREHENSIVE METABOLIC PANEL WITH GFR
ALT: 7 IU/L (ref 0–32)
AST: 13 IU/L (ref 0–40)
Albumin: 4.2 g/dL (ref 3.8–4.9)
Alkaline Phosphatase: 68 IU/L (ref 49–135)
BUN/Creatinine Ratio: 36 — ABNORMAL HIGH (ref 9–23)
BUN: 16 mg/dL (ref 6–24)
Bilirubin Total: <0.2 mg/dL (ref 0.0–1.2)
CO2: 25 mmol/L (ref 20–29)
Calcium: 9.3 mg/dL (ref 8.7–10.2)
Chloride: 104 mmol/L (ref 96–106)
Creatinine, Ser: 0.45 mg/dL — ABNORMAL LOW (ref 0.57–1.00)
Globulin, Total: 2.2 g/dL (ref 1.5–4.5)
Glucose: 97 mg/dL (ref 70–99)
Potassium: 3.9 mmol/L (ref 3.5–5.2)
Sodium: 140 mmol/L (ref 134–144)
Total Protein: 6.4 g/dL (ref 6.0–8.5)
eGFR: 111 mL/min/1.73 (ref >59)

## 2024-08-24 LAB — LIPID PANEL WITH LDL/HDL RATIO
Cholesterol, Total: 260 mg/dL — ABNORMAL HIGH (ref 100–199)
HDL: 68 mg/dL (ref >39)
LDL Chol Calc (NIH): 164 mg/dL — ABNORMAL HIGH (ref 0–99)
LDL/HDL Ratio: 2.4 ratio (ref 0.0–3.2)
Triglycerides: 159 mg/dL — ABNORMAL HIGH (ref 0–149)
VLDL Cholesterol Cal: 28 mg/dL (ref 5–40)

## 2024-08-24 LAB — VALPROIC ACID LEVEL: Valproic Acid Lvl: 8 ug/mL — ABNORMAL LOW (ref 50–100)

## 2024-08-24 NOTE — Progress Notes (Signed)
 New patient visit  Patient: Carla King   DOB: December 16, 1965   58 y.o. Female  MRN: [id number] Visit Date: 08/23/2024  Today's healthcare provider: Jolynn Spencer, PA-C   Chief Complaint  Patient presents with   Hospitalization Follow-up    07/09/24 f/u major depressive disorder, anxiety, bipolar Needs refills on all meds   New Patient (Initial Visit)    Establishing care   Subjective      Discussed the use of AI scribe software for clinical note transcription with the patient, who gave verbal consent to proceed.  History of Present Illness Carla King is a 58 year old female with major depression and anxiety who presents for a hospital follow-up and medication refills.  She was hospitalized in October for major depression and anxiety and has not seen psychiatry or psychology since discharge. She has been out of her psychiatric medications for about a week, which has worsened her distress.  Since that hospitalization she has taken Depakote  500 mg for about six weeks. She started zolpidem  in the hospital and found it very helpful for sleep after adverse effects with prior trazodone .  She has a history of alcohol and marijuana use,  and stopped sporadic marijuana use six months ago. She previously had ten years of sobriety and has attended AA.  She is frustrated with difficulty obtaining her medications and is here today to get refills.  She feels still kind of depressed and notes her mood has worsened off medications for over a week. She has sleep disturbance that improves with zolpidem .        No data to display             No data to display          Medications: Show/hide medication list[1]  Review of Systems All negative Except see HPI       Objective    BP 96/70   Pulse 73   Resp 14   Ht 5' 4 (1.626 m)   Wt 124 lb 4.8 oz (56.4 kg)   LMP 01/28/2015   SpO2 98%   BMI 21.34 kg/m     Physical Exam Vitals reviewed.  Constitutional:       General: She is not in acute distress.    Appearance: Normal appearance. She is well-developed. She is not diaphoretic.  HENT:     Head: Normocephalic and atraumatic.  Eyes:     General: No scleral icterus.    Conjunctiva/sclera: Conjunctivae normal.  Neck:     Thyroid : No thyromegaly.  Cardiovascular:     Rate and Rhythm: Normal rate and regular rhythm.     Pulses: Normal pulses.     Heart sounds: Normal heart sounds. No murmur heard. Pulmonary:     Effort: Pulmonary effort is normal. No respiratory distress.     Breath sounds: Normal breath sounds. No wheezing, rhonchi or rales.  Musculoskeletal:     Cervical back: Neck supple.     Right lower leg: No edema.     Left lower leg: No edema.  Lymphadenopathy:     Cervical: No cervical adenopathy.  Skin:    General: Skin is warm and dry.     Findings: No rash.  Neurological:     Mental Status: She is alert and oriented to person, place, and time. Mental status is at baseline.  Psychiatric:        Mood and Affect: Mood normal.        Behavior: Behavior normal.  Results for orders placed or performed in visit on 08/23/24  Comprehensive metabolic panel with GFR  Result Value Ref Range   Glucose 97 70 - 99 mg/dL   BUN 16 6 - 24 mg/dL   Creatinine, Ser 9.54 (L) 0.57 - 1.00 mg/dL   eGFR 888 >40 fO/fpw/8.26   BUN/Creatinine Ratio 36 (H) 9 - 23   Sodium 140 134 - 144 mmol/L   Potassium 3.9 3.5 - 5.2 mmol/L   Chloride 104 96 - 106 mmol/L   CO2 25 20 - 29 mmol/L   Calcium 9.3 8.7 - 10.2 mg/dL   Total Protein 6.4 6.0 - 8.5 g/dL   Albumin 4.2 3.8 - 4.9 g/dL   Globulin, Total 2.2 1.5 - 4.5 g/dL   Bilirubin Total <9.7 0.0 - 1.2 mg/dL   Alkaline Phosphatase 68 49 - 135 IU/L   AST 13 0 - 40 IU/L   ALT 7 0 - 32 IU/L  Valproic acid  level  Result Value Ref Range   Valproic Acid  Lvl 8 (L) 50 - 100 ug/mL  Lipid Panel With LDL/HDL Ratio  Result Value Ref Range   Cholesterol, Total 260 (H) 100 - 199 mg/dL   Triglycerides 840 (H)  0 - 149 mg/dL   HDL 68 >60 mg/dL   VLDL Cholesterol Cal 28 5 - 40 mg/dL   LDL Chol Calc (NIH) 835 (H) 0 - 99 mg/dL   LDL/HDL Ratio 2.4 0.0 - 3.2 ratio  Oxyco+Alc+Crt-Scr, Ur  Result Value Ref Range   Amphetamine Scrn, Ur WILL FOLLOW    BARBITURATE SCREEN URINE WILL FOLLOW    BENZODIAZEPINE SCREEN, URINE WILL FOLLOW    CANNABINOIDS UR QL SCN WILL FOLLOW    Cocaine (Metab) Scrn, Ur WILL FOLLOW    Opiate Scrn, Ur WILL FOLLOW    OXYCODONE+OXYMORPHONE UR QL SCN WILL FOLLOW    Phencyclidine Qn, Ur WILL FOLLOW    Methadone Screen, Urine WILL FOLLOW    Propoxyphene Scrn, Ur WILL FOLLOW    Creatinine(Crt), U WILL FOLLOW    Ph of Urine WILL FOLLOW    Ethanol, Urine WILL FOLLOW    PLEASE NOTE: Comment   Specimen status report  Result Value Ref Range   specimen status report Comment         Assessment & Plan Major depressive disorder and anxiety Chronic depression and anxiety exacerbated by medication lapse. Not under psychiatric care. Difficulty accessing care due to substance use history. - Prescribed bridge therapy for one month. - Referred to psychiatry for long-term management. - Scheduled appointment with integrated behavioral health for counseling. - Ensure voicemail is available for psychiatry contact. Collaboration of Care: Medication Management AEB  , Primary Care Provider AEB  , Psychiatrist AEB  , and Referral or follow-up with counselor/therapist AEB    Patient/Guardian was advised Release of Information must be obtained prior to any record release in order to collaborate their care with an outside provider. Patient/Guardian was advised if they have not already done so to contact the registration department to sign all necessary forms in order for us  to release information regarding their care.   Consent: Patient/Guardian gives verbal consent for treatment and assignment of benefits for services provided during this visit. Patient/Guardian expressed understanding and agreed to  proceed.    Bipolar 1 disorder Bipolar 1 disorder requires specialized psychiatric care for medication management. - Referred to psychiatry for management.  Insomnia Chronic insomnia previously managed with zolpidem . Discussed tapering and alternatives due to its controlled status. Likely  related to depression and anxiety. - Prescribed zolpidem  with tapering plan over one month. Considering history of substance abuse. - Consider alternative non-controlled substances if needed. Will follow-up  Alcohol use disorder in remission Alcohol use disorder in remission for eight to nine months. Engaged in [place]. - Continue participation in [place]. Pt was advised to proceed to [facility] for management. Pt refused  Anxiety associated with depression (Primary)  - Ambulatory referral to Psychiatry - Amb ref to [facility] - divalproex  (DEPAKOTE  ER) 500 MG 24 hr tablet; Take 2 tablets (1,000 mg total) by mouth at bedtime.  Dispense: 60 tablet; Refill: 0 - venlafaxine  XR (EFFEXOR -XR) 150 MG 24 hr capsule; Take 1 capsule (150 mg total) by mouth daily with breakfast.  Dispense: 30 capsule; Refill: 0 - zolpidem  (AMBIEN ) 10 MG tablet; Take 1 tablet (10 mg total) by mouth at bedtime. As needed  Dispense: 20 tablet; Refill: 0 - Comprehensive metabolic panel with GFR - Valproic acid  level - Lipid Panel With LDL/HDL Ratio - Drug Screen 12+Alcohol+CRT, Ur  Alcohol use, unspecified, in remission Per chart review, know for hallucinogenic mushrooms use disorder - Ambulatory referral to Psychiatry - Amb ref to [facility] - Comprehensive metabolic panel with GFR - Valproic acid  level - Lipid Panel With LDL/HDL Ratio - Drug Screen 12+Alcohol+CRT, Ur  Bipolar 1 disorder (HCC)  - Ambulatory referral to Psychiatry - Amb ref to [facility] - divalproex  (DEPAKOTE  ER) 500 MG 24 hr tablet; Take 2 tablets (1,000 mg total) by mouth at bedtime.  Dispense: 60 tablet; Refill:  0 - venlafaxine  XR (EFFEXOR -XR) 150 MG 24 hr capsule; Take 1 capsule (150 mg total) by mouth daily with breakfast.  Dispense: 30 capsule; Refill: 0 - zolpidem  (AMBIEN ) 10 MG tablet; Take 1 tablet (10 mg total) by mouth at bedtime. As needed  Dispense: 20 tablet; Refill: 0 - Drug Screen 12+Alcohol+CRT, Ur  Bipolar affective disorder, currently depressed, moderate (HCC)  - Ambulatory referral to Psychiatry - Amb ref to [facility] - divalproex  (DEPAKOTE  ER) 500 MG 24 hr tablet; Take 2 tablets (1,000 mg total) by mouth at bedtime.  Dispense: 60 tablet; Refill: 0 - venlafaxine  XR (EFFEXOR -XR) 150 MG 24 hr capsule; Take 1 capsule (150 mg total) by mouth daily with breakfast.  Dispense: 30 capsule; Refill: 0 - zolpidem  (AMBIEN ) 10 MG tablet; Take 1 tablet (10 mg total) by mouth at bedtime. As needed  Dispense: 20 tablet; Refill: 0  Major depressive disorder, recurrent episode, moderate degree (HCC)  - Ambulatory referral to Psychiatry - Amb ref to [facility] - divalproex  (DEPAKOTE  ER) 500 MG 24 hr tablet; Take 2 tablets (1,000 mg total) by mouth at bedtime.  Dispense: 60 tablet; Refill: 0 - venlafaxine  XR (EFFEXOR -XR) 150 MG 24 hr capsule; Take 1 capsule (150 mg total) by mouth daily with breakfast.  Dispense: 30 capsule; Refill: 0 - zolpidem  (AMBIEN ) 10 MG tablet; Take 1 tablet (10 mg total) by mouth at bedtime. As needed  Dispense: 20 tablet; Refill: 0 - Drug Screen 12+Alcohol+CRT, Ur  Cannabis abuse Chronic, see hpi Cannabis should not be use while taking ambien  Cessation advised    Orders Placed This Encounter  Procedures   Comprehensive metabolic panel with GFR   Valproic acid  level   Lipid Panel With LDL/HDL Ratio   Drug Screen 12+Alcohol+CRT, Ur   Oxyco+Alc+Crt-Scr, Ur   Specimen status report   Ambulatory referral to Psychiatry    Referral Priority:   Routine    Referral  Type:   Psychiatric    Referral Reason:   Specialty Services Required     Requested Specialty:   Psychiatry    Number of Visits Requested:   1   Amb ref to [facility]    Referral Priority:   Routine    Referral Type:   Consultation    Referral Reason:   Specialty Services Required    Number of Visits Requested:   1    Return in about 6 weeks (around 10/04/2024) for chronic disease f/u.   The patient was advised to call back or seek an in-person evaluation if the symptoms worsen or if the condition fails to improve as anticipated.  I discussed the assessment and treatment plan with the patient. The patient was provided an opportunity to ask questions and all were answered. The patient agreed with the plan and demonstrated an understanding of the instructions.  I, Jontae Sonier, PA-C have reviewed all documentation for this visit. The documentation on 08/23/2024  for the exam, diagnosis, procedures, and orders are all accurate and complete.  Jolynn Spencer, [facility], MMS [facility] [phone] (phone) [phone] (fax)  [facility]     [1]  Outpatient Medications Prior to Visit  Medication Sig   Multiple Vitamin (MULTIVITAMIN WITH MINERALS) TABS tablet Take 1 tablet by mouth daily.   [DISCONTINUED] divalproex  (DEPAKOTE  ER) 500 MG 24 hr tablet Take 2 tablets (1,000 mg total) by mouth at bedtime.   [DISCONTINUED] venlafaxine  XR (EFFEXOR -XR) 150 MG 24 hr capsule Take 1 capsule (150 mg total) by mouth daily with breakfast.   [DISCONTINUED] zolpidem  (AMBIEN ) 10 MG tablet Take 1 tablet (10 mg total) by mouth at bedtime.   [DISCONTINUED] nicotine  (NICODERM CQ  - DOSED IN MG/24 HOURS) 21 mg/24hr patch Place 1 patch (21 mg total) onto the skin daily at 6 (six) AM.   No facility-administered medications prior to visit.

## 2024-08-25 DIAGNOSIS — F5105 Insomnia due to other mental disorder: Secondary | ICD-10-CM | POA: Insufficient documentation

## 2024-08-28 LAB — OXYCO+ALC+CRT-SCR, UR
Creatinine(Crt), U: 129.4 mg/dL (ref 20.0–300.0)
Ph of Urine: 5.5 (ref 4.5–8.9)

## 2024-08-28 LAB — SPECIMEN STATUS REPORT

## 2024-08-29 ENCOUNTER — Ambulatory Visit: Payer: Self-pay | Admitting: Physician Assistant

## 2024-09-18 ENCOUNTER — Other Ambulatory Visit: Payer: Self-pay | Admitting: Physician Assistant

## 2024-09-18 DIAGNOSIS — F418 Other specified anxiety disorders: Secondary | ICD-10-CM

## 2024-09-18 DIAGNOSIS — F319 Bipolar disorder, unspecified: Secondary | ICD-10-CM

## 2024-09-18 DIAGNOSIS — F331 Major depressive disorder, recurrent, moderate: Secondary | ICD-10-CM

## 2024-10-08 NOTE — Progress Notes (Signed)
 " Established patient visit  Patient: Carla King   DOB: 1966/06/06   59 y.o. Female  MRN: [id number] Visit Date: 10/11/2024  Today's healthcare provider: Jolynn Spencer, PA-C   Chief Complaint  Patient presents with   Medical Management of Chronic Issues    6 weeks follow-up Patient reports that Effexor  and Depakote  did not work for her. She stopped the medications.  Patient Declined all care gaps.   Subjective     HPI     Medical Management of Chronic Issues    Additional comments: 6 weeks follow-up Patient reports that Effexor  and Depakote  did not work for her. She stopped the medications.  Patient Declined all care gaps.      Last edited by Rosas, Joseline E, CMA on 10/11/2024  1:16 PM.       Discussed the use of AI scribe software for clinical note transcription with the patient, who gave verbal consent to proceed.  History of Present Illness Carla King is a 59 year old female who presents with difficulty managing her medication regimen.  She has not taken her prescribed Depakote  for about a month, stopping after two weeks because she felt it was not helping and now feels very depressed.  She is seeing a therapist in [place] and is involved with AA mentorship but never received a call from the psychiatric referral placed in December.  She is trying to maintain diet and does some walking and meditation, which she describes as only a little, and she reports she managed through the holidays without major worsening of symptoms.      10/11/2024    1:13 PM  GAD 7 : Generalized Anxiety Score  Nervous, Anxious, on Edge 3  Control/stop worrying 3  Worry too much - different things 3  Trouble relaxing 3  Restless 3  Easily annoyed or irritable 3  Afraid - awful might happen 0  Total GAD 7 Score 18  Anxiety Difficulty Somewhat difficult    Medications: Show/hide medication list[1]  Review of Systems  All other systems reviewed and are negative.  All  negative Except see HPI       Objective    BP 110/76 (BP Location: Left Arm, Patient Position: Sitting, Cuff Size: Normal)   Pulse 82   Ht 5' 4 (1.626 m)   Wt 122 lb 12.8 oz (55.7 kg)   LMP 01/28/2015   SpO2 99%   BMI 21.08 kg/m     Physical Exam Vitals reviewed.  Constitutional:      General: She is not in acute distress.    Appearance: Normal appearance. She is well-developed. She is not diaphoretic.  HENT:     Head: Normocephalic and atraumatic.  Eyes:     General: No scleral icterus.    Conjunctiva/sclera: Conjunctivae normal.  Neck:     Thyroid : No thyromegaly.  Cardiovascular:     Rate and Rhythm: Normal rate and regular rhythm.     Pulses: Normal pulses.     Heart sounds: Normal heart sounds. No murmur heard. Pulmonary:     Effort: Pulmonary effort is normal. No respiratory distress.     Breath sounds: Normal breath sounds. No wheezing, rhonchi or rales.  Musculoskeletal:     Cervical back: Neck supple.     Right lower leg: No edema.     Left lower leg: No edema.  Lymphadenopathy:     Cervical: No cervical adenopathy.  Skin:    General: Skin is warm and dry.  Findings: No rash.  Neurological:     Mental Status: She is alert and oriented to person, place, and time. Mental status is at baseline.  Psychiatric:        Mood and Affect: Mood normal.        Behavior: Behavior normal.      No results found for any visits on 10/11/24.      Assessment & Plan Bipolar 1 disorder Chronic and unstable Non-adherence to medication led to depressive symptoms. Previous medications ineffective, likely due to dosing issues. Currently unmedicated with significant depressive symptoms. - Prescribed valproic acid  for 30 days as bridge therapy. - Expedited psychiatry appointment. Referral - pending. - Encouraged contacting [facility] for prompt psychiatrist adjustments. A [facility] is an urgent care 24/7. Pt encouraged to continue taking her current medication  - Advised  contacting psychiatrist in [place] for faster appointment. - Checked for psychiatry options accepting her insurance. Advised to schedule an appointment with [facility]. In -clinic counselor was contacted today. 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 Anxiety exacerbated by lack of medication and depressive symptoms. - Prescribed valproic acid  until psychiatric evaluation. Will follow-up  Alcohol use disorder in remission In remission, actively participating in AA mentorship. - Continue AA mentorship participation.  Continue therapy sessions in [place]. - Encouraged AA mentorship participation. - Prescribed valproic acid  until psychiatric evaluation. 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   No follow-ups on file.   The patient was advised to call back or seek an in-person evaluation if the symptoms worsen or if the condition fails to improve as anticipated.  I discussed the assessment and treatment plan with the patient. The patient was provided an opportunity to ask questions and all were answered. The patient agreed with the plan and demonstrated an understanding of the instructions.  I, Georgianne Gritz, PA-C have reviewed all documentation for this visit. The documentation on 10/11/2024  for the exam, diagnosis, procedures, and orders are all accurate and complete.  Jolynn Spencer, [facility], MMS [facility] [phone] (phone) [phone] (fax)  [facility]     [1]  Outpatient Medications Prior to Visit  Medication Sig   Multiple Vitamin (MULTIVITAMIN WITH  MINERALS) TABS tablet Take 1 tablet by mouth daily.   divalproex  (DEPAKOTE  ER) 500 MG 24 hr tablet TAKE 2 TABLETS BY MOUTH AT BEDTIME   venlafaxine  XR (EFFEXOR -XR) 150 MG 24 hr capsule TAKE 1 CAPSULE BY MOUTH DAILY WITH BREAKFAST   zolpidem  (AMBIEN ) 10 MG tablet Take 1 tablet (10 mg total) by mouth at bedtime. As needed (Patient not taking: Reported on 10/11/2024)   No facility-administered medications prior to visit.   "

## 2024-10-11 ENCOUNTER — Encounter: Payer: Self-pay | Admitting: Physician Assistant

## 2024-10-11 ENCOUNTER — Telehealth: Payer: Self-pay | Admitting: Licensed Clinical Social Worker

## 2024-10-11 ENCOUNTER — Ambulatory Visit: Admitting: Physician Assistant

## 2024-10-11 VITALS — BP 110/76 | HR 82 | Ht 64.0 in | Wt 122.8 lb

## 2024-10-11 DIAGNOSIS — F418 Other specified anxiety disorders: Secondary | ICD-10-CM | POA: Diagnosis not present

## 2024-10-11 DIAGNOSIS — F331 Major depressive disorder, recurrent, moderate: Secondary | ICD-10-CM

## 2024-10-11 DIAGNOSIS — F99 Mental disorder, not otherwise specified: Secondary | ICD-10-CM | POA: Diagnosis not present

## 2024-10-11 DIAGNOSIS — F1210 Cannabis abuse, uncomplicated: Secondary | ICD-10-CM

## 2024-10-11 DIAGNOSIS — F1620 Hallucinogen dependence, uncomplicated: Secondary | ICD-10-CM

## 2024-10-11 DIAGNOSIS — F1091 Alcohol use, unspecified, in remission: Secondary | ICD-10-CM | POA: Diagnosis not present

## 2024-10-11 DIAGNOSIS — F5105 Insomnia due to other mental disorder: Secondary | ICD-10-CM

## 2024-10-11 DIAGNOSIS — Z124 Encounter for screening for malignant neoplasm of cervix: Secondary | ICD-10-CM

## 2024-10-11 DIAGNOSIS — Z114 Encounter for screening for human immunodeficiency virus [HIV]: Secondary | ICD-10-CM

## 2024-10-11 DIAGNOSIS — Z1231 Encounter for screening mammogram for malignant neoplasm of breast: Secondary | ICD-10-CM

## 2024-10-11 DIAGNOSIS — Z1159 Encounter for screening for other viral diseases: Secondary | ICD-10-CM

## 2024-10-11 DIAGNOSIS — F319 Bipolar disorder, unspecified: Secondary | ICD-10-CM | POA: Diagnosis not present

## 2024-10-11 DIAGNOSIS — Z23 Encounter for immunization: Secondary | ICD-10-CM

## 2024-10-11 NOTE — Telephone Encounter (Signed)
 Per Jolynn Spencer PA C, [facility] clinician reached out to pt to schedule initial [facility] appointment.  Pt did not answer phone and [facility] clinician left HIPAA compliant voice mail to call office back to establish [facility].

## 2024-10-16 ENCOUNTER — Ambulatory Visit: Admitting: Physician Assistant
